# Patient Record
Sex: Female | Born: 1950 | Race: Black or African American | Hispanic: No | Smoking: Never smoker
Health system: Southern US, Community
[De-identification: ages and names within clinical notes are randomized; demographics above are authoritative.]

## PROBLEM LIST (undated history)

## (undated) DIAGNOSIS — B019 Varicella without complication: Secondary | ICD-10-CM

## (undated) DIAGNOSIS — I10 Essential (primary) hypertension: Secondary | ICD-10-CM

## (undated) DIAGNOSIS — M1990 Unspecified osteoarthritis, unspecified site: Secondary | ICD-10-CM

## (undated) DIAGNOSIS — D649 Anemia, unspecified: Secondary | ICD-10-CM

## (undated) DIAGNOSIS — E785 Hyperlipidemia, unspecified: Secondary | ICD-10-CM

## (undated) HISTORY — DX: Anemia, unspecified: D64.9

## (undated) HISTORY — PX: COLONOSCOPY: SHX174

## (undated) HISTORY — DX: Essential (primary) hypertension: I10

## (undated) HISTORY — DX: Unspecified osteoarthritis, unspecified site: M19.90

## (undated) HISTORY — DX: Varicella without complication: B01.9

---
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
## 1980-03-31 HISTORY — PX: TUBAL LIGATION: SHX77

## 1998-06-15 ENCOUNTER — Other Ambulatory Visit: Admission: RE | Admit: 1998-06-15 | Discharge: 1998-06-15 | Payer: Self-pay | Admitting: Gynecology

## 2000-03-05 ENCOUNTER — Other Ambulatory Visit: Admission: RE | Admit: 2000-03-05 | Discharge: 2000-03-05 | Payer: Self-pay | Admitting: Gynecology

## 2001-03-05 ENCOUNTER — Encounter: Admission: RE | Admit: 2001-03-05 | Discharge: 2001-03-05 | Payer: Self-pay | Admitting: General Surgery

## 2001-03-05 ENCOUNTER — Encounter: Payer: Self-pay | Admitting: General Surgery

## 2001-04-06 ENCOUNTER — Other Ambulatory Visit: Admission: RE | Admit: 2001-04-06 | Discharge: 2001-04-06 | Payer: Self-pay | Admitting: Gynecology

## 2001-08-19 ENCOUNTER — Encounter: Payer: Self-pay | Admitting: Gynecology

## 2001-08-19 ENCOUNTER — Encounter: Admission: RE | Admit: 2001-08-19 | Discharge: 2001-08-19 | Payer: Self-pay | Admitting: Gynecology

## 2002-02-07 ENCOUNTER — Encounter: Payer: Self-pay | Admitting: Gynecology

## 2002-02-07 ENCOUNTER — Encounter: Admission: RE | Admit: 2002-02-07 | Discharge: 2002-02-07 | Payer: Self-pay | Admitting: Gynecology

## 2002-05-17 ENCOUNTER — Other Ambulatory Visit: Admission: RE | Admit: 2002-05-17 | Discharge: 2002-05-17 | Payer: Self-pay | Admitting: Gynecology

## 2003-02-13 ENCOUNTER — Encounter: Admission: RE | Admit: 2003-02-13 | Discharge: 2003-02-13 | Payer: Self-pay | Admitting: Gynecology

## 2003-06-22 ENCOUNTER — Other Ambulatory Visit: Admission: RE | Admit: 2003-06-22 | Discharge: 2003-06-22 | Payer: Self-pay | Admitting: Gynecology

## 2004-02-14 ENCOUNTER — Encounter: Admission: RE | Admit: 2004-02-14 | Discharge: 2004-02-14 | Payer: Self-pay | Admitting: Gynecology

## 2004-07-09 ENCOUNTER — Other Ambulatory Visit: Admission: RE | Admit: 2004-07-09 | Discharge: 2004-07-09 | Payer: Self-pay | Admitting: Gynecology

## 2005-02-17 ENCOUNTER — Encounter: Admission: RE | Admit: 2005-02-17 | Discharge: 2005-02-17 | Payer: Self-pay | Admitting: Gynecology

## 2005-07-07 ENCOUNTER — Other Ambulatory Visit: Admission: RE | Admit: 2005-07-07 | Discharge: 2005-07-07 | Payer: Self-pay | Admitting: Gynecology

## 2006-02-18 ENCOUNTER — Encounter: Admission: RE | Admit: 2006-02-18 | Discharge: 2006-02-18 | Payer: Self-pay | Admitting: Gynecology

## 2006-07-13 ENCOUNTER — Other Ambulatory Visit: Admission: RE | Admit: 2006-07-13 | Discharge: 2006-07-13 | Payer: Self-pay | Admitting: Gynecology

## 2007-02-22 ENCOUNTER — Encounter: Admission: RE | Admit: 2007-02-22 | Discharge: 2007-02-22 | Payer: Self-pay | Admitting: Gynecology

## 2007-07-26 ENCOUNTER — Other Ambulatory Visit: Admission: RE | Admit: 2007-07-26 | Discharge: 2007-07-26 | Payer: Self-pay | Admitting: Gynecology

## 2008-02-23 ENCOUNTER — Encounter: Admission: RE | Admit: 2008-02-23 | Discharge: 2008-02-23 | Payer: Self-pay | Admitting: Gynecology

## 2009-02-23 ENCOUNTER — Encounter: Admission: RE | Admit: 2009-02-23 | Discharge: 2009-02-23 | Payer: Self-pay | Admitting: Gynecology

## 2009-08-03 ENCOUNTER — Emergency Department (HOSPITAL_COMMUNITY): Admission: EM | Admit: 2009-08-03 | Discharge: 2009-08-03 | Payer: Self-pay | Admitting: Emergency Medicine

## 2010-03-01 ENCOUNTER — Encounter: Admission: RE | Admit: 2010-03-01 | Discharge: 2010-03-01 | Payer: Self-pay | Admitting: Gynecology

## 2010-06-18 LAB — URINALYSIS, ROUTINE W REFLEX MICROSCOPIC
Nitrite: NEGATIVE
Protein, ur: NEGATIVE mg/dL
Urobilinogen, UA: 0.2 mg/dL (ref 0.0–1.0)

## 2010-06-18 LAB — COMPREHENSIVE METABOLIC PANEL
AST: 21 U/L (ref 0–37)
CO2: 30 mEq/L (ref 19–32)
Calcium: 9.1 mg/dL (ref 8.4–10.5)
Creatinine, Ser: 1.12 mg/dL (ref 0.4–1.2)
GFR calc Af Amer: >60 mL/min (ref >60)
GFR calc non Af Amer: 50 mL/min — ABNORMAL LOW (ref >60)
Total Protein: 6.7 g/dL (ref 6.0–8.3)

## 2010-06-18 LAB — CBC
MCV: 98.5 fL (ref 78.0–100.0)
Platelets: 242 10*3/uL (ref 150–400)
WBC: 4.5 10*3/uL (ref 4.0–10.5)

## 2010-06-18 LAB — TSH: TSH: 2.385 u[IU]/mL (ref 0.350–4.500)

## 2010-06-18 LAB — DIFFERENTIAL
Eosinophils Relative: 6 % — ABNORMAL HIGH (ref 0–5)
Lymphocytes Relative: 44 % (ref 12–46)
Lymphs Abs: 2 10*3/uL (ref 0.7–4.0)
Neutrophils Relative %: 43 % (ref 43–77)

## 2010-06-18 LAB — TROPONIN I: Troponin I: <0.01 ng/mL (ref 0.00–0.06)

## 2010-06-18 LAB — CK TOTAL AND CKMB (NOT AT ARMC)
CK, MB: 2.7 ng/mL (ref 0.3–4.0)
Total CK: 149 U/L (ref 7–177)

## 2011-02-17 ENCOUNTER — Other Ambulatory Visit: Payer: Self-pay | Admitting: Gynecology

## 2011-02-17 DIAGNOSIS — Z1231 Encounter for screening mammogram for malignant neoplasm of breast: Secondary | ICD-10-CM

## 2011-03-28 ENCOUNTER — Ambulatory Visit
Admission: RE | Admit: 2011-03-28 | Discharge: 2011-03-28 | Disposition: A | Discharge status: Home / Self Care | Payer: Self-pay | Source: Ambulatory Visit | Attending: Gynecology | Admitting: Gynecology

## 2011-03-28 DIAGNOSIS — Z1231 Encounter for screening mammogram for malignant neoplasm of breast: Secondary | ICD-10-CM

## 2012-03-03 ENCOUNTER — Other Ambulatory Visit: Payer: Self-pay | Admitting: Gynecology

## 2012-03-03 DIAGNOSIS — Z1231 Encounter for screening mammogram for malignant neoplasm of breast: Secondary | ICD-10-CM

## 2012-03-30 ENCOUNTER — Ambulatory Visit
Admission: RE | Admit: 2012-03-30 | Discharge: 2012-03-30 | Disposition: A | Discharge status: Home / Self Care | Payer: BC Managed Care – PPO | Source: Ambulatory Visit | Attending: Gynecology | Admitting: Gynecology

## 2012-03-30 DIAGNOSIS — Z1231 Encounter for screening mammogram for malignant neoplasm of breast: Secondary | ICD-10-CM

## 2013-03-14 ENCOUNTER — Other Ambulatory Visit: Payer: Self-pay

## 2013-03-14 DIAGNOSIS — Z1231 Encounter for screening mammogram for malignant neoplasm of breast: Secondary | ICD-10-CM

## 2013-04-19 ENCOUNTER — Ambulatory Visit
Admission: RE | Admit: 2013-04-19 | Discharge: 2013-04-19 | Disposition: A | Discharge status: Home / Self Care | Payer: BC Managed Care – PPO | Source: Ambulatory Visit

## 2013-04-19 DIAGNOSIS — Z1231 Encounter for screening mammogram for malignant neoplasm of breast: Secondary | ICD-10-CM

## 2014-09-11 ENCOUNTER — Other Ambulatory Visit: Payer: Self-pay

## 2014-09-11 DIAGNOSIS — Z1231 Encounter for screening mammogram for malignant neoplasm of breast: Secondary | ICD-10-CM

## 2014-10-04 ENCOUNTER — Ambulatory Visit
Admission: RE | Admit: 2014-10-04 | Discharge: 2014-10-04 | Disposition: A | Discharge status: Home / Self Care | Payer: BLUE CROSS/BLUE SHIELD | Source: Ambulatory Visit

## 2014-10-04 DIAGNOSIS — Z1231 Encounter for screening mammogram for malignant neoplasm of breast: Secondary | ICD-10-CM

## 2015-08-22 ENCOUNTER — Other Ambulatory Visit: Payer: Self-pay

## 2015-08-22 DIAGNOSIS — Z1231 Encounter for screening mammogram for malignant neoplasm of breast: Secondary | ICD-10-CM

## 2015-10-05 ENCOUNTER — Ambulatory Visit
Admission: RE | Admit: 2015-10-05 | Discharge: 2015-10-05 | Disposition: A | Discharge status: Home / Self Care | Payer: BLUE CROSS/BLUE SHIELD | Source: Ambulatory Visit

## 2015-10-05 DIAGNOSIS — Z1231 Encounter for screening mammogram for malignant neoplasm of breast: Secondary | ICD-10-CM

## 2016-07-17 ENCOUNTER — Other Ambulatory Visit: Payer: Self-pay | Admitting: Family Medicine

## 2016-07-17 DIAGNOSIS — Z1231 Encounter for screening mammogram for malignant neoplasm of breast: Secondary | ICD-10-CM

## 2016-10-07 ENCOUNTER — Encounter: Payer: Self-pay | Admitting: Radiology

## 2016-10-07 ENCOUNTER — Ambulatory Visit
Admission: RE | Admit: 2016-10-07 | Discharge: 2016-10-07 | Disposition: A | Discharge status: Home / Self Care | Payer: BLUE CROSS/BLUE SHIELD | Source: Ambulatory Visit | Attending: Family Medicine | Admitting: Family Medicine

## 2016-10-07 DIAGNOSIS — Z1231 Encounter for screening mammogram for malignant neoplasm of breast: Secondary | ICD-10-CM

## 2017-04-09 ENCOUNTER — Other Ambulatory Visit: Payer: Self-pay | Admitting: Orthopedic Surgery

## 2017-06-15 ENCOUNTER — Other Ambulatory Visit: Payer: Self-pay

## 2017-06-15 ENCOUNTER — Encounter (HOSPITAL_BASED_OUTPATIENT_CLINIC_OR_DEPARTMENT_OTHER): Payer: Self-pay | Admitting: *Deleted

## 2017-06-16 ENCOUNTER — Encounter (HOSPITAL_BASED_OUTPATIENT_CLINIC_OR_DEPARTMENT_OTHER)
Admission: RE | Admit: 2017-06-16 | Discharge: 2017-06-16 | Disposition: A | Discharge status: Home / Self Care | Payer: BLUE CROSS/BLUE SHIELD | Source: Ambulatory Visit | Attending: Orthopedic Surgery | Admitting: Orthopedic Surgery

## 2017-06-16 DIAGNOSIS — Z01818 Encounter for other preprocedural examination: Secondary | ICD-10-CM | POA: Insufficient documentation

## 2017-06-16 LAB — BASIC METABOLIC PANEL
ANION GAP: 8 (ref 5–15)
BUN: 23 mg/dL — AB (ref 6–20)
CHLORIDE: 107 mmol/L (ref 101–111)
CO2: 24 mmol/L (ref 22–32)
Calcium: 8.8 mg/dL — ABNORMAL LOW (ref 8.9–10.3)
Creatinine, Ser: 0.88 mg/dL (ref 0.44–1.00)
GFR calc non Af Amer: >60 mL/min (ref >60)
Glucose, Bld: 87 mg/dL (ref 65–99)
POTASSIUM: 4.6 mmol/L (ref 3.5–5.1)
SODIUM: 139 mmol/L (ref 135–145)

## 2017-06-17 NOTE — Progress Notes (Signed)
Lab results reviewed by Dr. Richardson LandryHouser, will proceed with surgery as scheduled.

## 2017-06-22 ENCOUNTER — Encounter (HOSPITAL_BASED_OUTPATIENT_CLINIC_OR_DEPARTMENT_OTHER)
Admission: RE | Disposition: A | Discharge status: Home / Self Care | Payer: Self-pay | Source: Ambulatory Visit | Attending: Orthopedic Surgery

## 2017-06-22 ENCOUNTER — Ambulatory Visit (HOSPITAL_BASED_OUTPATIENT_CLINIC_OR_DEPARTMENT_OTHER)
Admission: RE | Admit: 2017-06-22 | Discharge: 2017-06-22 | Disposition: A | Discharge status: Home / Self Care | Payer: BLUE CROSS/BLUE SHIELD | Source: Ambulatory Visit | Attending: Orthopedic Surgery | Admitting: Orthopedic Surgery

## 2017-06-22 ENCOUNTER — Encounter (HOSPITAL_BASED_OUTPATIENT_CLINIC_OR_DEPARTMENT_OTHER): Payer: Self-pay

## 2017-06-22 ENCOUNTER — Other Ambulatory Visit: Payer: Self-pay

## 2017-06-22 ENCOUNTER — Ambulatory Visit (HOSPITAL_BASED_OUTPATIENT_CLINIC_OR_DEPARTMENT_OTHER): Payer: BLUE CROSS/BLUE SHIELD | Admitting: Anesthesiology

## 2017-06-22 DIAGNOSIS — G5602 Carpal tunnel syndrome, left upper limb: Secondary | ICD-10-CM | POA: Diagnosis present

## 2017-06-22 DIAGNOSIS — Z79899 Other long term (current) drug therapy: Secondary | ICD-10-CM | POA: Diagnosis not present

## 2017-06-22 HISTORY — PX: CARPAL TUNNEL RELEASE: SHX101

## 2017-06-22 SURGERY — CARPAL TUNNEL RELEASE
Anesthesia: Regional | Site: Wrist | Laterality: Left

## 2017-06-22 MED ORDER — LACTATED RINGERS IV SOLN
INTRAVENOUS | Status: DC
  Administered 2017-06-22: 12:00:00 via INTRAVENOUS

## 2017-06-22 MED ORDER — SCOPOLAMINE 1 MG/3DAYS TD PT72: 1.0000 | MEDICATED_PATCH | Freq: Once | TRANSDERMAL | Status: DC | PRN

## 2017-06-22 MED ORDER — FENTANYL CITRATE (PF) 100 MCG/2ML IJ SOLN: 25.0000 ug | INTRAMUSCULAR | Status: DC | PRN

## 2017-06-22 MED ORDER — HYDROCODONE-ACETAMINOPHEN 5-325 MG PO TABS: ORAL_TABLET | ORAL | 0 refills | Status: DC

## 2017-06-22 MED ORDER — ONDANSETRON HCL 4 MG/2ML IJ SOLN
INTRAMUSCULAR | Status: DC | PRN
  Administered 2017-06-22: 4 mg via INTRAVENOUS

## 2017-06-22 MED ORDER — FENTANYL CITRATE (PF) 100 MCG/2ML IJ SOLN
INTRAMUSCULAR | Status: AC
  Filled 2017-06-22: qty 2

## 2017-06-22 MED ORDER — PROPOFOL 500 MG/50ML IV EMUL
INTRAVENOUS | Status: DC | PRN
  Administered 2017-06-22: 50 ug/kg/min via INTRAVENOUS

## 2017-06-22 MED ORDER — PROPOFOL 10 MG/ML IV BOLUS
INTRAVENOUS | Status: AC
  Filled 2017-06-22: qty 20

## 2017-06-22 MED ORDER — FENTANYL CITRATE (PF) 100 MCG/2ML IJ SOLN
50.0000 ug | INTRAMUSCULAR | Status: DC | PRN
  Administered 2017-06-22: 50 ug via INTRAVENOUS

## 2017-06-22 MED ORDER — CEFAZOLIN SODIUM-DEXTROSE 2-4 GM/100ML-% IV SOLN
INTRAVENOUS | Status: AC
  Filled 2017-06-22: qty 100

## 2017-06-22 MED ORDER — CHLORHEXIDINE GLUCONATE 4 % EX LIQD: 60.0000 mL | Freq: Once | CUTANEOUS | Status: DC

## 2017-06-22 MED ORDER — CEFAZOLIN SODIUM-DEXTROSE 2-4 GM/100ML-% IV SOLN
2.0000 g | INTRAVENOUS | Status: AC
  Administered 2017-06-22: 2 g via INTRAVENOUS

## 2017-06-22 MED ORDER — MIDAZOLAM HCL 2 MG/2ML IJ SOLN
INTRAMUSCULAR | Status: AC
  Filled 2017-06-22: qty 2

## 2017-06-22 MED ORDER — LACTATED RINGERS IV SOLN: INTRAVENOUS | Status: DC

## 2017-06-22 MED ORDER — MIDAZOLAM HCL 2 MG/2ML IJ SOLN
1.0000 mg | INTRAMUSCULAR | Status: DC | PRN
  Administered 2017-06-22: 1 mg via INTRAVENOUS

## 2017-06-22 MED ORDER — MEPERIDINE HCL 25 MG/ML IJ SOLN: 6.2500 mg | INTRAMUSCULAR | Status: DC | PRN

## 2017-06-22 MED ORDER — LIDOCAINE HCL (PF) 0.5 % IJ SOLN
INTRAMUSCULAR | Status: AC
  Filled 2017-06-22: qty 100

## 2017-06-22 MED ORDER — LIDOCAINE HCL (PF) 0.5 % IJ SOLN
INTRAMUSCULAR | Status: DC | PRN
  Administered 2017-06-22: 35 mL via INTRAVENOUS

## 2017-06-22 MED ORDER — BUPIVACAINE HCL (PF) 0.25 % IJ SOLN
INTRAMUSCULAR | Status: AC
  Filled 2017-06-22: qty 30

## 2017-06-22 MED ORDER — METOCLOPRAMIDE HCL 5 MG/ML IJ SOLN: 10.0000 mg | Freq: Once | INTRAMUSCULAR | Status: DC | PRN

## 2017-06-22 MED ORDER — LIDOCAINE HCL (CARDIAC) 20 MG/ML IV SOLN
INTRAVENOUS | Status: AC
  Filled 2017-06-22: qty 5

## 2017-06-22 SURGICAL SUPPLY — 37 items
BANDAGE ACE 3X5.8 VEL STRL LF (GAUZE/BANDAGES/DRESSINGS) ×2 IMPLANT
BLADE SURG 15 STRL LF DISP TIS (BLADE) ×2 IMPLANT
BLADE SURG 15 STRL SS (BLADE) ×4
BNDG CMPR 9X4 STRL LF SNTH (GAUZE/BANDAGES/DRESSINGS) ×1
BNDG ESMARK 4X9 LF (GAUZE/BANDAGES/DRESSINGS) ×1 IMPLANT
BNDG GAUZE ELAST 4 BULKY (GAUZE/BANDAGES/DRESSINGS) ×2 IMPLANT
CHLORAPREP W/TINT 26ML (MISCELLANEOUS) ×2 IMPLANT
CORD BIPOLAR FORCEPS 12FT (ELECTRODE) ×2 IMPLANT
COVER BACK TABLE 60X90IN (DRAPES) ×2 IMPLANT
COVER MAYO STAND STRL (DRAPES) ×2 IMPLANT
CUFF TOURNIQUET SINGLE 18IN (TOURNIQUET CUFF) ×2 IMPLANT
DRAPE EXTREMITY T 121X128X90 (DRAPE) ×2 IMPLANT
DRAPE SURG 17X23 STRL (DRAPES) ×2 IMPLANT
DRSG PAD ABDOMINAL 8X10 ST (GAUZE/BANDAGES/DRESSINGS) ×2 IMPLANT
GAUZE SPONGE 4X4 12PLY STRL (GAUZE/BANDAGES/DRESSINGS) ×2 IMPLANT
GAUZE XEROFORM 1X8 LF (GAUZE/BANDAGES/DRESSINGS) ×2 IMPLANT
GLOVE BIO SURGEON STRL SZ 6.5 (GLOVE) ×1 IMPLANT
GLOVE BIO SURGEON STRL SZ7.5 (GLOVE) ×2 IMPLANT
GLOVE BIOGEL PI IND STRL 7.0 (GLOVE) IMPLANT
GLOVE BIOGEL PI IND STRL 8 (GLOVE) ×1 IMPLANT
GLOVE BIOGEL PI INDICATOR 7.0 (GLOVE) ×1
GLOVE BIOGEL PI INDICATOR 8 (GLOVE) ×2
GOWN STRL REUS W/ TWL LRG LVL3 (GOWN DISPOSABLE) ×1 IMPLANT
GOWN STRL REUS W/TWL LRG LVL3 (GOWN DISPOSABLE) ×2
GOWN STRL REUS W/TWL XL LVL3 (GOWN DISPOSABLE) ×2 IMPLANT
NDL HYPO 25X1 1.5 SAFETY (NEEDLE) ×1 IMPLANT
NEEDLE HYPO 25X1 1.5 SAFETY (NEEDLE) ×2 IMPLANT
NS IRRIG 1000ML POUR BTL (IV SOLUTION) ×2 IMPLANT
PACK BASIN DAY SURGERY FS (CUSTOM PROCEDURE TRAY) ×2 IMPLANT
PADDING CAST ABS 4INX4YD NS (CAST SUPPLIES)
PADDING CAST ABS COTTON 4X4 ST (CAST SUPPLIES) ×1 IMPLANT
STOCKINETTE 4X48 STRL (DRAPES) ×2 IMPLANT
SUT ETHILON 4 0 PS 2 18 (SUTURE) ×2 IMPLANT
SYR BULB 3OZ (MISCELLANEOUS) ×2 IMPLANT
SYR CONTROL 10ML LL (SYRINGE) ×2 IMPLANT
TOWEL OR 17X24 6PK STRL BLUE (TOWEL DISPOSABLE) ×4 IMPLANT
UNDERPAD 30X30 (UNDERPADS AND DIAPERS) ×2 IMPLANT

## 2017-06-22 NOTE — Transfer of Care (Signed)
Immediate Anesthesia Transfer of Care Note  Patient: Christine Pitts  Procedure(s) Performed: LEFT CARPAL TUNNEL RELEASE (Left Wrist)  Patient Location: PACU  Anesthesia Type:MAC and Bier block  Level of Consciousness: awake, alert  and oriented  Airway & Oxygen Therapy: Patient Spontanous Breathing  Post-op Assessment: Report given to RN and Post -op Vital signs reviewed and stable  Post vital signs: Reviewed and stable  Last Vitals:  Vitals Value Taken Time  BP    Temp    Pulse    Resp    SpO2      Last Pain:  Vitals:   06/22/17 1154  TempSrc: Oral  PainSc: 0-No pain         Complications: No apparent anesthesia complications

## 2017-06-22 NOTE — Discharge Instructions (Addendum)
° °  ° ° ° °[facility] Center Instructions °Hand Surgery ° °Wound Care: °Keep your hand elevated above the level of your heart.  Do not allow it to dangle by your side.  Keep the dressing dry and do not remove it unless your doctor advises you to do so.  He will usually change it at the time of your post-op visit.  Moving your fingers is advised to stimulate circulation but will depend on the site of your surgery.  If you have a splint applied, your doctor will advise you regarding movement. ° °Activity: °Do not drive or operate machinery today.  Rest today and then you may return to your normal activity and work as indicated by your physician. ° °Diet:  °Drink liquids today or eat a light diet.  You may resume a regular diet tomorrow.   ° °General expectations: °Pain for two to three days. °Fingers may become slightly swollen. ° °Call your doctor if any of the following occur: °Severe pain not relieved by pain medication. °Elevated temperature. °Dressing soaked with blood. °Inability to move fingers. °White or bluish color to fingers. ° ° °Regional Anesthesia Blocks ° °1. Numbness or the inability to move the "blocked" extremity may last from 3-48 hours after placement. The length of time depends on the medication injected and your individual response to the medication. If the numbness is not going away after 48 hours, call your surgeon. ° °2. The extremity that is blocked will need to be protected until the numbness is gone and the  Strength has returned. Because you cannot feel it, you will need to take extra care to avoid injury. Because it may be weak, you may have difficulty moving it or using it. You may not know what position it is in without looking at it while the block is in effect. ° °3. For blocks in the legs and feet, returning to weight bearing and walking needs to be done carefully. You will need to wait until the numbness is entirely gone and the strength has returned. You should be able to move your leg  and foot normally before you try and bear weight or walk. You will need someone to be with you when you first try to ensure you do not fall and possibly risk injury. ° °4. Bruising and tenderness at the needle site are common side effects and will resolve in a few days. ° °5. Persistent numbness or new problems with movement should be communicated to the surgeon or the [facility] ([phone])/ [facility] ([phone]). ° ° ° °Post Anesthesia Home Care Instructions ° °Activity: °Get plenty of rest for the remainder of the day. A responsible individual must stay with you for 24 hours following the procedure.  °For the next 24 hours, DO NOT: °-Drive a car °-Operate machinery °-Drink alcoholic beverages °-Take any medication unless instructed by your physician °-Make any legal decisions or sign important papers. ° °Meals: °Start with liquid foods such as gelatin or soup. Progress to regular foods as tolerated. Avoid greasy, spicy, heavy foods. If nausea and/or vomiting occur, drink only clear liquids until the nausea and/or vomiting subsides. Call your physician if vomiting continues. ° °Special Instructions/Symptoms: °Your throat may feel dry or sore from the anesthesia or the breathing tube placed in your throat during surgery. If this causes discomfort, gargle with warm salt water. The discomfort should disappear within 24 hours. ° °If you had a scopolamine patch placed behind your ear for   the management of post- operative nausea and/or vomiting: ° °1. The medication in the patch is effective for 72 hours, after which it should be removed.  Wrap patch in a tissue and discard in the trash. Wash hands thoroughly with soap and water. °2. You may remove the patch earlier than 72 hours if you experience unpleasant side effects which may include dry mouth, dizziness or visual disturbances. °3. Avoid touching the patch. Wash your hands with soap and water after contact with the patch. °  ° ° °

## 2017-06-22 NOTE — Op Note (Signed)
06/22/2017 [facility]                              OPERATIVE REPORT   PREOPERATIVE DIAGNOSIS:  Left carpal tunnel syndrome.  POSTOPERATIVE DIAGNOSIS:  Left carpal tunnel syndrome.  PROCEDURE:  Left carpal tunnel release.  SURGEON:  Betha LoaKevin Mylen Mangan, MD  ASSISTANT:  none.  ANESTHESIA:  Bier block and sedation.  IV FLUIDS:  Per anesthesia flow sheet.  ESTIMATED BLOOD LOSS:  Minimal.  COMPLICATIONS:  None.  SPECIMENS:  None.  TOURNIQUET TIME:    Total Tourniquet Time Documented: Forearm (Left) - 23 minutes Total: Forearm (Left) - 23 minutes   DISPOSITION:  Stable to PACU.  LOCATION: [facility]  INDICATIONS:  67 yo female with numbness in left hand.  Positive nerve conduction studies.  She wishes to have a carpal tunnel release for management of her symptoms.  Risks, benefits and alternatives of surgery were discussed including the risk of blood loss; infection; damage to nerves, vessels, tendons, ligaments, bone; failure of surgery; need for additional surgery; complications with wound healing; continued pain; recurrence of carpal tunnel syndrome; and damage to motor branch. She voiced understanding of these risks and elected to proceed.   OPERATIVE COURSE:  After being identified preoperatively by myself, the patient and I agreed upon the procedure and site of procedure.  The surgical site was marked.  The risks, benefits, and alternatives of the surgery were reviewed and she wished to proceed.  Surgical consent had been signed.  She was given IV Ancef as preoperative antibiotic prophylaxis.  She was transferred to the operating room and placed on the operating room table in supine position with the Left upper extremity on an armboard.  Bier block and sedation was induced by Anesthesiology.  Left upper extremity was prepped and draped in normal sterile orthopaedic fashion.  A surgical pause was performed between the surgeons, anesthesia, and operating room  staff, and all were in agreement as to the patient, procedure, and site of procedure.  Tourniquet at the proximal aspect of the forearm had been inflated for the Bier block.   Incision was made over the transverse carpal ligament and carried into the subcutaneous tissues by spreading technique.  Bipolar electrocautery was used to obtain hemostasis.  The palmar fascia was sharply incised.  The transverse carpal ligament was identified and sharply incised.  It was incised distally first.  Care was taken to ensure complete decompression distally.  It was then incised proximally.  Scissors were used to split the distal aspect of the volar antebrachial fascia.  A finger was placed into the wound to ensure complete decompression, which was the case.  The nerve was examined.  It was adherent to the radial leaflet.  The motor branch was identified and was intact.  The wound was copiously irrigated with sterile saline.  It was then closed with 4-0 nylon in a horizontal mattress fashion.  It was injected with 0.25% plain Marcaine to aid in postoperative analgesia.  It was dressed with sterile Xeroform, 4x4s, an ABD, and wrapped with Kerlix and an Ace bandage.  Tourniquet was deflated at 23 minutes.  Fingertips were pink with brisk capillary refill after deflation of the tourniquet.  Operative drapes were broken down.  The patient was awoken from anesthesia safely.  She was transferred back to stretcher and taken to the PACU in stable condition.  I will see her back in the  office in 1 week for postoperative followup.  I will give her a prescription for norco 5/325 1-2 tabs PO q6 hours prn pain, dispense #20.    Tami Ribas, MD Electronically signed, 06/22/17

## 2017-06-22 NOTE — Brief Op Note (Signed)
06/22/2017  2:31 PM  PATIENT:  Christine Pitts  67 y.o. female  PRE-OPERATIVE DIAGNOSIS:  Left carpal tunnel syndrome  POST OPERATIVE DIAGNOSIS: Left carpal tunnel syndrome  PROCEDURE:  Procedure(s): LEFT CARPAL TUNNEL RELEASE (Left)  SURGEON:  Surgeon(s) and Role:    Betha Loa* Bandy Honaker, MD - Primary  PHYSICIAN ASSISTANT:   ASSISTANTS: none   ANESTHESIA:   Bier block with sedation  EBL:  Minimal   BLOOD ADMINISTERED:none  DRAINS: none   LOCAL MEDICATIONS USED:  MARCAINE     SPECIMEN:  No Specimen  DISPOSITION OF SPECIMEN:  N/A  COUNTS:  YES  TOURNIQUET:   Total Tourniquet Time Documented: Forearm (Left) - 23 minutes Total: Forearm (Left) - 23 minutes   DICTATION: .Note written in EPIC  PLAN OF CARE: Discharge to home after PACU  PATIENT DISPOSITION:  PACU - hemodynamically stable.

## 2017-06-22 NOTE — Anesthesia Preprocedure Evaluation (Signed)
Anesthesia Evaluation  Patient identified by MRN, date of birth, ID band Patient awake    Reviewed: Allergy & Precautions, NPO status , Patient's Chart, lab work & pertinent test results  Airway Mallampati: II  TM Distance: >3 FB Neck ROM: Full    Dental no notable dental hx.    Pulmonary neg pulmonary ROS,    Pulmonary exam normal breath sounds clear to auscultation       Cardiovascular negative cardio ROS Normal cardiovascular exam Rhythm:Regular Rate:Normal     Neuro/Psych negative neurological ROS  negative psych ROS   GI/Hepatic negative GI ROS, Neg liver ROS,   Endo/Other  negative endocrine ROS  Renal/GU negative Renal ROS  negative genitourinary   Musculoskeletal negative musculoskeletal ROS (+)   Abdominal   Peds negative pediatric ROS (+)  Hematology negative hematology ROS (+)   Anesthesia Other Findings   Reproductive/Obstetrics negative OB ROS                             Anesthesia Physical Anesthesia Plan  ASA: I  Anesthesia Plan: Bier Block and Bier Block-LIDOCAINE ONLY   Post-op Pain Management:    Induction:   PONV Risk Score and Plan: 2 and Treatment may vary due to age or medical condition  Airway Management Planned: Simple Face Mask  Additional Equipment:   Intra-op Plan:   Post-operative Plan:   Informed Consent: I have reviewed the patients History and Physical, chart, labs and discussed the procedure including the risks, benefits and alternatives for the proposed anesthesia with the patient or authorized representative who has indicated his/her understanding and acceptance.   Dental advisory given  Plan Discussed with:   Anesthesia Plan Comments:         Anesthesia Quick Evaluation

## 2017-06-22 NOTE — Anesthesia Procedure Notes (Signed)
Anesthesia Regional Block: Bier block (IV Regional)   Pre-Anesthetic Checklist: ,, timeout performed, Correct Patient, Correct Site, Correct Laterality, Correct Procedure, Correct Position, site marked, Risks and benefits discussed,  Surgical consent,  Pre-op evaluation,  At surgeon's request and post-op pain management  Laterality: Left  Prep: alcohol swabs        Procedures:,,,,,,,, #20gu IV placed  Narrative:  CRNA: Elanor Cale M, CRNA      

## 2017-06-22 NOTE — H&P (Signed)
  Christine Pitts is an 67 y.o. female.   Chief Complaint: left carpal tunnel syndrome HPI: 67 yo female with numbness in left long finger.  Positive nerve conduction studies.  She wishes to have a carpal tunnel release.  Allergies: No Known Allergies  History reviewed. No pertinent past medical history.  Past Surgical History:  Procedure Laterality Date  . TONSILLECTOMY  1962  . TUBAL LIGATION  1982    Family History: History reviewed. No pertinent family history.  Social History:   reports that she has never smoked. She has never used smokeless tobacco. She reports that she does not drink alcohol or use drugs.  Medications: Medications Prior to Admission  Medication Sig Dispense Refill  . Aspirin-Acetaminophen-Caffeine (GOODY HEADACHE PO) Take by mouth.    . hydrochlorothiazide (HYDRODIURIL) 25 MG tablet Take 25 mg by mouth daily.    . trandolapril-verapamil (TARKA) 2-240 MG tablet Take 1 tablet by mouth daily.      No results found for this or any previous visit (from the past 48 hour(s)).  No results found.   A comprehensive review of systems was negative.  Blood pressure 97/71, pulse 68, temperature 97.6 F (36.4 C), temperature source Oral, resp. rate 16, height 5\' 5"  (1.651 m), weight 73 kg (161 lb), SpO2 100 %.  General appearance: alert, cooperative and appears stated age Head: Normocephalic, without obvious abnormality, atraumatic Neck: supple, symmetrical, trachea midline Cardio: regular rate and rhythm Resp: clear to auscultation bilaterally Extremities: Intact sensation and capillary refill all digits.  +epl/fpl/io.  No wounds.  Pulses: 2+ and symmetric Skin: Skin color, texture, turgor normal. No rashes or lesions Neurologic: Grossly normal Incision/Wound: none  Assessment/Plan Left carpal tunnel syndrome.  Non operative and operative treatment options were discussed with the patient and patient wishes to proceed with operative treatment. Risks,  benefits, and alternatives of surgery were discussed and the patient agrees with the plan of care.   Nasean Zapf R 06/22/2017, 1:25 PM

## 2017-06-23 ENCOUNTER — Encounter (HOSPITAL_BASED_OUTPATIENT_CLINIC_OR_DEPARTMENT_OTHER): Payer: Self-pay | Admitting: Orthopedic Surgery

## 2017-06-23 NOTE — Anesthesia Postprocedure Evaluation (Signed)
Anesthesia Post Note  Patient: Christine Pitts  Procedure(s) Performed: LEFT CARPAL TUNNEL RELEASE (Left Wrist)     Patient location during evaluation: PACU Anesthesia Type: Bier Block Level of consciousness: awake and alert Pain management: pain level controlled Vital Signs Assessment: post-procedure vital signs reviewed and stable Respiratory status: spontaneous breathing, nonlabored ventilation, respiratory function stable and patient connected to nasal cannula oxygen Cardiovascular status: stable and blood pressure returned to baseline Postop Assessment: no apparent nausea or vomiting Anesthetic complications: no    Last Vitals:  Vitals:   06/22/17 1500 06/22/17 1515  BP: 121/84 119/72  Pulse: (!) 56 (!) 58  Resp: 13 14  Temp:  36.5 C  SpO2: 100% 100%    Last Pain:  Vitals:   06/22/17 1515  TempSrc:   PainSc: 0-No pain                 Phillips Groutarignan, Sheronda Parran

## 2017-06-24 ENCOUNTER — Encounter (HOSPITAL_BASED_OUTPATIENT_CLINIC_OR_DEPARTMENT_OTHER): Payer: Self-pay | Admitting: Orthopedic Surgery

## 2017-09-16 ENCOUNTER — Other Ambulatory Visit: Payer: Self-pay | Admitting: Internal Medicine

## 2017-09-16 DIAGNOSIS — E2839 Other primary ovarian failure: Secondary | ICD-10-CM

## 2017-09-22 ENCOUNTER — Other Ambulatory Visit: Payer: Self-pay | Admitting: Internal Medicine

## 2017-09-22 ENCOUNTER — Other Ambulatory Visit: Payer: Self-pay | Admitting: Family Medicine

## 2017-09-22 ENCOUNTER — Other Ambulatory Visit: Payer: Self-pay | Admitting: Obstetrics & Gynecology

## 2017-09-22 DIAGNOSIS — Z1231 Encounter for screening mammogram for malignant neoplasm of breast: Secondary | ICD-10-CM

## 2017-11-03 ENCOUNTER — Ambulatory Visit: Payer: Medicare Other

## 2017-11-03 ENCOUNTER — Other Ambulatory Visit: Payer: Medicare Other

## 2017-12-16 ENCOUNTER — Ambulatory Visit
Admission: RE | Admit: 2017-12-16 | Discharge: 2017-12-16 | Disposition: A | Discharge status: Home / Self Care | Payer: Medicare Other | Source: Ambulatory Visit | Attending: Obstetrics & Gynecology | Admitting: Obstetrics & Gynecology

## 2017-12-16 ENCOUNTER — Ambulatory Visit
Admission: RE | Admit: 2017-12-16 | Discharge: 2017-12-16 | Disposition: A | Discharge status: Home / Self Care | Payer: Medicare Other | Source: Ambulatory Visit | Attending: Internal Medicine | Admitting: Internal Medicine

## 2017-12-16 DIAGNOSIS — Z1231 Encounter for screening mammogram for malignant neoplasm of breast: Secondary | ICD-10-CM

## 2017-12-16 DIAGNOSIS — E2839 Other primary ovarian failure: Secondary | ICD-10-CM

## 2018-01-15 ENCOUNTER — Other Ambulatory Visit (INDEPENDENT_AMBULATORY_CARE_PROVIDER_SITE_OTHER): Payer: Medicare Other

## 2018-01-15 ENCOUNTER — Encounter: Payer: Self-pay | Admitting: Nurse Practitioner

## 2018-01-15 ENCOUNTER — Ambulatory Visit (INDEPENDENT_AMBULATORY_CARE_PROVIDER_SITE_OTHER): Payer: Medicare Other | Admitting: Nurse Practitioner

## 2018-01-15 VITALS — BP 134/78 | HR 74 | Temp 98.0°F | Ht 65.0 in | Wt 165.0 lb

## 2018-01-15 DIAGNOSIS — R609 Edema, unspecified: Secondary | ICD-10-CM

## 2018-01-15 DIAGNOSIS — I10 Essential (primary) hypertension: Secondary | ICD-10-CM

## 2018-01-15 LAB — BASIC METABOLIC PANEL
BUN: 20 mg/dL (ref 6–23)
CALCIUM: 9 mg/dL (ref 8.4–10.5)
CO2: 28 mEq/L (ref 19–32)
CREATININE: 0.84 mg/dL (ref 0.40–1.20)
Chloride: 105 mEq/L (ref 96–112)
GFR: 86.96 mL/min (ref >60.00)
GLUCOSE: 84 mg/dL (ref 70–99)
Potassium: 3.7 mEq/L (ref 3.5–5.1)
Sodium: 142 mEq/L (ref 135–145)

## 2018-01-15 MED ORDER — FUROSEMIDE 20 MG PO TABS: 20.0000 mg | ORAL_TABLET | Freq: Every day | ORAL | 2 refills | Status: DC

## 2018-01-15 MED ORDER — VALSARTAN 80 MG PO TABS: 80.0000 mg | ORAL_TABLET | Freq: Every day | ORAL | 1 refills | Status: DC

## 2018-01-15 NOTE — Assessment & Plan Note (Signed)
No edema noted on PE today Continue lasix 20 daily Update BMET Home management-elevation, compression, red flags and return precautions including when to seek immediate care discussed and printed on AVS F/U for new, worsening symptoms - furosemide (LASIX) 20 MG tablet; Take 1 tablet (20 mg total) by mouth daily.  Dispense: 30 tablet; Refill: 2

## 2018-01-15 NOTE — Patient Instructions (Addendum)
STOP losartan HCTZ CONTINUE lasix 20 daily START valsartan 80mg  daily.  Please try to check your blood pressure once daily or at least a few times a week, at the same time each day, and keep a log.   See you in 2 weeks   Edema Edema is when you have too much fluid in your body or under your skin. Edema may make your legs, feet, and ankles swell up. Swelling is also common in looser tissues, like around your eyes. This is a common condition. It gets more common as you get older. There are many possible causes of edema. Eating too much salt (sodium) and being on your feet or sitting for a long time can cause edema in your legs, feet, and ankles. Hot weather may make edema worse. Edema is usually painless. Your skin may look swollen or shiny. Follow these instructions at home:  Keep the swollen body part raised (elevated) above the level of your heart when you are sitting or lying down.  Do not sit still or stand for a long time.  Do not wear tight clothes. Do not wear garters on your upper legs.  Exercise your legs. This can help the swelling go down.  Wear elastic bandages or support stockings as told by your doctor.  Eat a low-salt (low-sodium) diet to reduce fluid as told by your doctor.  Depending on the cause of your swelling, you may need to limit how much fluid you drink (fluid restriction).  Take over-the-counter and prescription medicines only as told by your doctor. Contact a doctor if:  Treatment is not working.  You have heart, liver, or kidney disease and have symptoms of edema.  You have sudden and unexplained weight gain. Get help right away if:  You have shortness of breath or chest pain.  You cannot breathe when you lie down.  You have pain, redness, or warmth in the swollen areas.  You have heart, liver, or kidney disease and get edema all of a sudden.  You have a fever and your symptoms get worse all of a sudden. Summary  Edema is when you have too  much fluid in your body or under your skin.  Edema may make your legs, feet, and ankles swell up. Swelling is also common in looser tissues, like around your eyes.  Raise (elevate) the swollen body part above the level of your heart when you are sitting or lying down.  Follow your doctor's instructions about diet and how much fluid you can drink (fluid restriction). This information is not intended to replace advice given to you by your health care provider. Make sure you discuss any questions you have with your health care provider. Document Released: 09/03/2007 Document Revised: 04/04/2016 Document Reviewed: 04/04/2016 Elsevier Interactive Patient Education  2017 [employer].

## 2018-01-15 NOTE — Progress Notes (Signed)
Christine Pitts is a 67 y.o. female with the following history as recorded in EpicCare:  There are no active problems to display for this patient.   Current Outpatient Medications  Medication Sig Dispense Refill  . furosemide (LASIX) 20 MG tablet Take 1 tablet (20 mg total) by mouth daily. 30 tablet 2  . valsartan (DIOVAN) 80 MG tablet Take 1 tablet (80 mg total) by mouth daily. 30 tablet 1   No current facility-administered medications for this visit.     Allergies: Patient has no known allergies.  History reviewed. No pertinent past medical history.  Past Surgical History:  Procedure Laterality Date  . CARPAL TUNNEL RELEASE Left 06/22/2017   Procedure: LEFT CARPAL TUNNEL RELEASE;  Surgeon: Betha Loa, MD;  Location: [facility];  Service: Orthopedics;  Laterality: Left;  . TONSILLECTOMY  1962  . TUBAL LIGATION  1982    History reviewed. No pertinent family history.  Social History   Tobacco Use  . Smoking status: Never Smoker  . Smokeless tobacco: Never Used  Substance Use Topics  . Alcohol use: No    Frequency: Never     Subjective:  Ms Like is here today to establish care as a new patient to our practice, she has been getting regular care from an urgent care until now, has new insurance plan so decided to establish with PCP. Aside from primary care, she routinely follows with GYN provider for annual womens care. We will review daily medications today, per current med list is maintained on losartan-HCTZ 50-12.5 dialy for HTN, lasix 20 prn for edema.  she tells me she does not take losartan-HCTZ regularly, it makes her feel tired and she does not like the side effects, she only takes occasionally when she feels a headache or feels her blood pressure might be up. She does not monitor her BP at home. For the swelling in her legs, she has been taking her lasix 20 daily for some time now, does notice it seems to help the swelling, however She has started going to the  gym regularly this year and notices the swelling is a little worse after going to the gym, but once she takes her lasix the swelling is better. She has not noted any adverse effects to the lasix and would really like to keep taking it She overall feels well, she Denies weakness, syncope, confusion, vision changes, chest pain, shortness of breath, palpitations. Her legs feel achy at times but denies erythema, warmth, or tenderness in her legs.  BP Readings from Last 3 Encounters:  01/15/18 134/78  06/22/17 119/72   Wt Readings from Last 3 Encounters:  01/15/18 165 lb (74.8 kg)  06/22/17 161 lb (73 kg)   ROS- See HPI  Objective:  Vitals:   01/15/18 0824  BP: 134/78  Pulse: 74  Temp: 98 F (36.7 C)  TempSrc: Oral  SpO2: 98%  Weight: 165 lb (74.8 kg)  Height: 5\' 5"  (1.651 m)    General: Well developed, well nourished, in no acute distress  Skin : Warm and dry.  Head: Normocephalic and atraumatic  Eyes: Sclera and conjunctiva clear; pupils round and reactive to light; extraocular movements intact  Oropharynx: Pink, supple. No suspicious lesions  Neck: Supple Lungs: Respirations unlabored; clear to auscultation bilaterally without wheeze, rales, rhonchi  CVS exam: normal rate, regular rhythm, normal S1, S2, no murmurs, rubs, clicks or gallops.  Abdomen: Soft; nontender; nondistended Musculoskeletal: No deformities; no active joint inflammation  Extremities: No edema,  cyanosis Vessels: Symmetric bilaterally  Neurologic: Alert and oriented; speech intact; face symmetrical; moves all extremities well; CNII-XII intact without focal deficit   Assessment:  1. Essential hypertension   2. Edema, unspecified type     Plan:   Return in about 2 weeks (around 01/29/2018) for BP follow up- medication adjustments- recheck BP, BMET.  Orders Placed This Encounter  Procedures  . Basic metabolic panel    Standing Status:   Future    Standing Expiration Date:   01/16/2019    Requested  Prescriptions   Signed Prescriptions Disp Refills  . valsartan (DIOVAN) 80 MG tablet 30 tablet 1    Sig: Take 1 tablet (80 mg total) by mouth daily.  . furosemide (LASIX) 20 MG tablet 30 tablet 2    Sig: Take 1 tablet (20 mg total) by mouth daily.

## 2018-01-15 NOTE — Assessment & Plan Note (Signed)
Will keep ARB for renal protection, she does not like losartan and will not likely be compliant so we will try valsartan instead, continue lasix  Update BMET Encouraged to continue healthy diet, exercise, and monitor BP at home RTC in 2 weeks for F/U- recheck BP, BMET - Basic metabolic panel; Future - valsartan (DIOVAN) 80 MG tablet; Take 1 tablet (80 mg total) by mouth daily.  Dispense: 30 tablet; Refill: 1 - furosemide (LASIX) 20 MG tablet; Take 1 tablet (20 mg total) by mouth daily.  Dispense: 30 tablet; Refill: 2

## 2018-02-10 ENCOUNTER — Encounter: Payer: Self-pay | Admitting: Nurse Practitioner

## 2018-02-10 ENCOUNTER — Other Ambulatory Visit (INDEPENDENT_AMBULATORY_CARE_PROVIDER_SITE_OTHER): Payer: Medicare Other

## 2018-02-10 ENCOUNTER — Ambulatory Visit (INDEPENDENT_AMBULATORY_CARE_PROVIDER_SITE_OTHER): Payer: Medicare Other | Admitting: Nurse Practitioner

## 2018-02-10 VITALS — BP 120/86 | HR 75 | Ht 65.0 in | Wt 164.0 lb

## 2018-02-10 DIAGNOSIS — I10 Essential (primary) hypertension: Secondary | ICD-10-CM | POA: Diagnosis not present

## 2018-02-10 DIAGNOSIS — Z23 Encounter for immunization: Secondary | ICD-10-CM | POA: Diagnosis not present

## 2018-02-10 LAB — BASIC METABOLIC PANEL
BUN: 23 mg/dL (ref 6–23)
CHLORIDE: 102 meq/L (ref 96–112)
CO2: 31 mEq/L (ref 19–32)
Calcium: 9.5 mg/dL (ref 8.4–10.5)
Creatinine, Ser: 0.85 mg/dL (ref 0.40–1.20)
GFR: 85.77 mL/min (ref >60.00)
GLUCOSE: 100 mg/dL — AB (ref 70–99)
POTASSIUM: 3.5 meq/L (ref 3.5–5.1)
SODIUM: 142 meq/L (ref 135–145)

## 2018-02-10 NOTE — Progress Notes (Signed)
Christine Pitts is a 67 y.o. female with the following history as recorded in EpicCare:  Patient Active Problem List   Diagnosis Date Noted  . Essential hypertension 01/15/2018  . Edema 01/15/2018    Current Outpatient Medications  Medication Sig Dispense Refill  . furosemide (LASIX) 20 MG tablet Take 1 tablet (20 mg total) by mouth daily. 30 tablet 2  . valsartan (DIOVAN) 80 MG tablet Take 1 tablet (80 mg total) by mouth daily. 30 tablet 1   No current facility-administered medications for this visit.     Allergies: Patient has no known allergies.  Past Medical History:  Diagnosis Date  . Arthritis   . Chicken pox   . Hypertension     Past Surgical History:  Procedure Laterality Date  . CARPAL TUNNEL RELEASE Left 06/22/2017   Procedure: LEFT CARPAL TUNNEL RELEASE;  Surgeon: Betha Loa, MD;  Location: [facility];  Service: Orthopedics;  Laterality: Left;  . TONSILLECTOMY  1962  . TUBAL LIGATION  1982    Family History  Problem Relation Age of Onset  . Arthritis Mother   . Heart attack Mother   . Stroke Mother   . Hypertension Father     Social History   Tobacco Use  . Smoking status: Never Smoker  . Smokeless tobacco: Never Used  Substance Use Topics  . Alcohol use: No    Frequency: Never     Subjective:   Here today for HTN follow up, at her last visit on 01/15/18, her BP was elevated but she was not taking her losartan-HCTZ daily due to medication side effects. We decided to try her on different medication for her blood pressure, she was started on valsartan 80mg  daily, she was also continued on lasix 20 daily. Today, she tells me that she has been taking valsartan daily as prescribed, tolerating well, has not noted any adverse effects. Has also started trying to walk more, walks several miles a day on treadmill Does not check her BP at home Denies weakness, syncope, confusion, vision changes, chest pain, shortness of breath.  BP Readings from  Last 3 Encounters:  02/10/18 120/86  01/15/18 134/78  06/22/17 119/72    ROS- See HPI  Objective:  Vitals:   02/10/18 0924  BP: 120/86  Pulse: 75  SpO2: 96%  Weight: 164 lb (74.4 kg)  Height: 5\' 5"  (1.651 m)    Body mass index is 27.29 kg/m.  General: Well developed, well nourished, in no acute distress  Skin : Warm and dry.  Head: Normocephalic and atraumatic  Eyes: Sclera and conjunctiva clear; pupils round and reactive to light; extraocular movements intact  Oropharynx: Pink, supple. No suspicious lesions  Neck: Supple Lungs: Respirations unlabored; clear to auscultation bilaterally without wheeze, rales, rhonchi  CVS exam: normal rate, regular rhythm, normal S1, S2, no murmurs, rubs, clicks or gallops.  Extremities: No edema, cyanosis Vessels: Symmetric bilaterally  Neurologic: Alert and oriented; speech intact; face symmetrical; moves all extremities well; CNII-XII intact without focal deficit    Assessment:  1. Essential hypertension   2. Need for Tdap vaccination     Plan:   Return in about 3 months (around 05/13/2018) for CPE, BP recheck.  Orders Placed This Encounter  Procedures  . Tdap vaccine greater than or equal to 7yo IM  . Basic metabolic panel    Standing Status:   Future    Standing Expiration Date:   02/11/2019    Requested Prescriptions    No  prescriptions requested or ordered in this encounter   Reviewed HM: Need for Tdap vaccination- Tdap vaccine greater than or equal to 7yo IM

## 2018-02-10 NOTE — Patient Instructions (Signed)
Head downstairs for lab work today  Please return in about 3 months for annual physical with fasting lab work   DASH Eating Plan DASH stands for "Dietary Approaches to Stop Hypertension." The DASH eating plan is a healthy eating plan that has been shown to reduce high blood pressure (hypertension). It may also reduce your risk for type 2 diabetes, heart disease, and stroke. The DASH eating plan may also help with weight loss. What are tips for following this plan? General guidelines  Avoid eating more than 2,300 mg (milligrams) of salt (sodium) a day. If you have hypertension, you may need to reduce your sodium intake to 1,500 mg a day.  Limit alcohol intake to no more than 1 drink a day for nonpregnant women and 2 drinks a day for men. One drink equals 12 oz of beer, 5 oz of wine, or 1 oz of hard liquor.  Work with your health care provider to maintain a healthy body weight or to lose weight. Ask what an ideal weight is for you.  Get at least 30 minutes of exercise that causes your heart to beat faster (aerobic exercise) most days of the week. Activities may include walking, swimming, or biking.  Work with your health care provider or diet and nutrition specialist (dietitian) to adjust your eating plan to your individual calorie needs. Reading food labels  Check food labels for the amount of sodium per serving. Choose foods with less than 5 percent of the Daily Value of sodium. Generally, foods with less than 300 mg of sodium per serving fit into this eating plan.  To find whole grains, look for the word "whole" as the first word in the ingredient list. Shopping  Buy products labeled as "low-sodium" or "no salt added."  Buy fresh foods. Avoid canned foods and premade or frozen meals. Cooking  Avoid adding salt when cooking. Use salt-free seasonings or herbs instead of table salt or sea salt. Check with your health care provider or pharmacist before using salt substitutes.  Do not  fry foods. Cook foods using healthy methods such as baking, boiling, grilling, and broiling instead.  Cook with heart-healthy oils, such as olive, canola, soybean, or sunflower oil. Meal planning   Eat a balanced diet that includes: ? 5 or more servings of fruits and vegetables each day. At each meal, try to fill half of your plate with fruits and vegetables. ? Up to 6-8 servings of whole grains each day. ? Less than 6 oz of lean meat, poultry, or fish each day. A 3-oz serving of meat is about the same size as a deck of cards. One egg equals 1 oz. ? 2 servings of low-fat dairy each day. ? A serving of nuts, seeds, or beans 5 times each week. ? Heart-healthy fats. Healthy fats called Omega-3 fatty acids are found in foods such as flaxseeds and coldwater fish, like sardines, salmon, and mackerel.  Limit how much you eat of the following: ? Canned or prepackaged foods. ? Food that is high in trans fat, such as fried foods. ? Food that is high in saturated fat, such as fatty meat. ? Sweets, desserts, sugary drinks, and other foods with added sugar. ? Full-fat dairy products.  Do not salt foods before eating.  Try to eat at least 2 vegetarian meals each week.  Eat more home-cooked food and less restaurant, buffet, and fast food.  When eating at a restaurant, ask that your food be prepared with less salt or no  salt, if possible. What foods are recommended? The items listed may not be a complete list. Talk with your dietitian about what dietary choices are best for you. Grains Whole-grain or whole-wheat bread. Whole-grain or whole-wheat pasta. Brown rice. Modena Morrow. Bulgur. Whole-grain and low-sodium cereals. Pita bread. Low-fat, low-sodium crackers. Whole-wheat flour tortillas. Vegetables Fresh or frozen vegetables (raw, steamed, roasted, or grilled). Low-sodium or reduced-sodium tomato and vegetable juice. Low-sodium or reduced-sodium tomato sauce and tomato paste. Low-sodium or  reduced-sodium canned vegetables. Fruits All fresh, dried, or frozen fruit. Canned fruit in natural juice (without added sugar). Meat and other protein foods Skinless chicken or [place]. Ground chicken or [place]. Pork with fat trimmed off. Fish and seafood. Egg whites. Dried beans, peas, or lentils. Unsalted nuts, nut butters, and seeds. Unsalted canned beans. Lean cuts of beef with fat trimmed off. Low-sodium, lean deli meat. Dairy Low-fat (1%) or fat-free (skim) milk. Fat-free, low-fat, or reduced-fat cheeses. Nonfat, low-sodium ricotta or cottage cheese. Low-fat or nonfat yogurt. Low-fat, low-sodium cheese. Fats and oils Soft margarine without trans fats. Vegetable oil. Low-fat, reduced-fat, or light mayonnaise and salad dressings (reduced-sodium). Canola, safflower, olive, soybean, and sunflower oils. Avocado. Seasoning and other foods Herbs. Spices. Seasoning mixes without salt. Unsalted popcorn and pretzels. Fat-free sweets. What foods are not recommended? The items listed may not be a complete list. Talk with your dietitian about what dietary choices are best for you. Grains Baked goods made with fat, such as croissants, muffins, or some breads. Dry pasta or rice meal packs. Vegetables Creamed or fried vegetables. Vegetables in a cheese sauce. Regular canned vegetables (not low-sodium or reduced-sodium). Regular canned tomato sauce and paste (not low-sodium or reduced-sodium). Regular tomato and vegetable juice (not low-sodium or reduced-sodium). Angie Fava. Olives. Fruits Canned fruit in a light or heavy syrup. Fried fruit. Fruit in cream or butter sauce. Meat and other protein foods Fatty cuts of meat. Ribs. Fried meat. Berniece Salines. Sausage. Bologna and other processed lunch meats. Salami. Fatback. Hotdogs. Bratwurst. Salted nuts and seeds. Canned beans with added salt. Canned or smoked fish. Whole eggs or egg yolks. Chicken or [place] with skin. Dairy Whole or 2% milk, cream, and half-and-half.  Whole or full-fat cream cheese. Whole-fat or sweetened yogurt. Full-fat cheese. Nondairy creamers. Whipped toppings. Processed cheese and cheese spreads. Fats and oils Butter. Stick margarine. Lard. Shortening. Ghee. Bacon fat. Tropical oils, such as coconut, palm kernel, or palm oil. Seasoning and other foods Salted popcorn and pretzels. Onion salt, garlic salt, seasoned salt, table salt, and sea salt. Worcestershire sauce. Tartar sauce. Barbecue sauce. Teriyaki sauce. Soy sauce, including reduced-sodium. Steak sauce. Canned and packaged gravies. Fish sauce. Oyster sauce. Cocktail sauce. Horseradish that you find on the shelf. Ketchup. Mustard. Meat flavorings and tenderizers. Bouillon cubes. Hot sauce and Tabasco sauce. Premade or packaged marinades. Premade or packaged taco seasonings. Relishes. Regular salad dressings. Where to find more information:  National Heart, Lung, and [place]: https://wilson-eaton.com/  American Heart Association: www.heart.org Summary  The DASH eating plan is a healthy eating plan that has been shown to reduce high blood pressure (hypertension). It may also reduce your risk for type 2 diabetes, heart disease, and stroke.  With the DASH eating plan, you should limit salt (sodium) intake to 2,300 mg a day. If you have hypertension, you may need to reduce your sodium intake to 1,500 mg a day.  When on the DASH eating plan, aim to eat more fresh fruits and vegetables, whole grains, lean proteins, low-fat dairy, and heart-healthy  fats.  Work with your health care provider or diet and nutrition specialist (dietitian) to adjust your eating plan to your individual calorie needs. This information is not intended to replace advice given to you by your health care provider. Make sure you discuss any questions you have with your health care provider. Document Released: 03/06/2011 Document Revised: 03/10/2016 Document Reviewed: 03/10/2016 Elsevier Interactive Patient Education   [employer].

## 2018-02-10 NOTE — Assessment & Plan Note (Addendum)
BP has improved on valsartan, will continue  Update BMET Discussed the role of healthy diet and exercise in the management of HTN. Additional education provided on AVS RTC in 3 months for CPE, BP recheck - Basic metabolic panel; Future

## 2018-03-11 ENCOUNTER — Other Ambulatory Visit: Payer: Self-pay | Admitting: Nurse Practitioner

## 2018-03-11 DIAGNOSIS — I10 Essential (primary) hypertension: Secondary | ICD-10-CM

## 2018-05-17 ENCOUNTER — Encounter: Payer: Self-pay | Admitting: Nurse Practitioner

## 2018-05-17 ENCOUNTER — Other Ambulatory Visit (INDEPENDENT_AMBULATORY_CARE_PROVIDER_SITE_OTHER): Payer: Medicare Other

## 2018-05-17 ENCOUNTER — Ambulatory Visit (INDEPENDENT_AMBULATORY_CARE_PROVIDER_SITE_OTHER): Payer: Medicare Other | Admitting: Nurse Practitioner

## 2018-05-17 VITALS — BP 130/88 | HR 80 | Ht 65.0 in | Wt 163.0 lb

## 2018-05-17 DIAGNOSIS — R7309 Other abnormal glucose: Secondary | ICD-10-CM

## 2018-05-17 DIAGNOSIS — I10 Essential (primary) hypertension: Secondary | ICD-10-CM

## 2018-05-17 DIAGNOSIS — Z0000 Encounter for general adult medical examination without abnormal findings: Secondary | ICD-10-CM

## 2018-05-17 DIAGNOSIS — Z23 Encounter for immunization: Secondary | ICD-10-CM

## 2018-05-17 DIAGNOSIS — Z1159 Encounter for screening for other viral diseases: Secondary | ICD-10-CM | POA: Diagnosis not present

## 2018-05-17 DIAGNOSIS — Z1211 Encounter for screening for malignant neoplasm of colon: Secondary | ICD-10-CM | POA: Insufficient documentation

## 2018-05-17 DIAGNOSIS — Z9189 Other specified personal risk factors, not elsewhere classified: Secondary | ICD-10-CM

## 2018-05-17 DIAGNOSIS — Z13220 Encounter for screening for lipoid disorders: Secondary | ICD-10-CM

## 2018-05-17 LAB — CBC
HCT: 39.1 % (ref 36.0–46.0)
Hemoglobin: 12.9 g/dL (ref 12.0–15.0)
MCHC: 33 g/dL (ref 30.0–36.0)
MCV: 97 fl (ref 78.0–100.0)
Platelets: 360 10*3/uL (ref 150.0–400.0)
RBC: 4.02 Mil/uL (ref 3.87–5.11)
RDW: 13.9 % (ref 11.5–15.5)
WBC: 3.7 10*3/uL — ABNORMAL LOW (ref 4.0–10.5)

## 2018-05-17 LAB — LIPID PANEL
Cholesterol: 232 mg/dL — ABNORMAL HIGH (ref 0–200)
HDL: 81.9 mg/dL (ref >39.00)
LDL Cholesterol: 134 mg/dL — ABNORMAL HIGH (ref 0–99)
NonHDL: 150.57
Total CHOL/HDL Ratio: 3
Triglycerides: 81 mg/dL (ref 0.0–149.0)
VLDL: 16.2 mg/dL (ref 0.0–40.0)

## 2018-05-17 LAB — COMPREHENSIVE METABOLIC PANEL
ALK PHOS: 95 U/L (ref 39–117)
ALT: 12 U/L (ref 0–35)
AST: 14 U/L (ref 0–37)
Albumin: 4.6 g/dL (ref 3.5–5.2)
BUN: 29 mg/dL — ABNORMAL HIGH (ref 6–23)
CO2: 27 mEq/L (ref 19–32)
Calcium: 9.4 mg/dL (ref 8.4–10.5)
Chloride: 104 mEq/L (ref 96–112)
Creatinine, Ser: 0.87 mg/dL (ref 0.40–1.20)
GFR: 78.5 mL/min (ref >60.00)
Glucose, Bld: 89 mg/dL (ref 70–99)
Potassium: 3.7 mEq/L (ref 3.5–5.1)
SODIUM: 142 meq/L (ref 135–145)
Total Bilirubin: 0.4 mg/dL (ref 0.2–1.2)
Total Protein: 7.3 g/dL (ref 6.0–8.3)

## 2018-05-17 LAB — HEMOGLOBIN A1C: Hgb A1c MFr Bld: 5.5 % (ref 4.6–6.5)

## 2018-05-17 NOTE — Assessment & Plan Note (Signed)
Stable Continue current medications Update annual labs - Comprehensive metabolic panel; Future - CBC; Future - Lipid panel; Future

## 2018-05-17 NOTE — Assessment & Plan Note (Addendum)
Reviewed annual screening exams, healthy lifestyle, additional information provided on AVS She will look for colonoscopy records to update chart, we also sent request to [facility]- sounds like this might be where she had her colonoscopy - Comprehensive metabolic panel; Future - CBC; Future - Lipid panel; Future - Hemoglobin A1c; Future - Hepatitis C antibody; Future  Encounter for hepatitis C virus screening test for high risk patient - Hepatitis C antibody; Future  Elevated glucose - Comprehensive metabolic panel; Future - Hemoglobin A1c; Future  Screening for cholesterol level She did take a few sips of diet drink this am, otherwise fasting - Lipid panel; Future  Need for pneumococcal vaccination - Pneumococcal conjugate vaccine 13-valent IM

## 2018-05-17 NOTE — Progress Notes (Signed)
Christine Pitts is a 68 y.o. female with the following history as recorded in EpicCare:  Patient Active Problem List   Diagnosis Date Noted  . Routine general medical examination at a health care facility 05/17/2018  . Essential hypertension 01/15/2018  . Edema 01/15/2018    Current Outpatient Medications  Medication Sig Dispense Refill  . furosemide (LASIX) 20 MG tablet Take 1 tablet (20 mg total) by mouth daily. 30 tablet 2  . valsartan (DIOVAN) 80 MG tablet TAKE 1 TABLET(80 MG) BY MOUTH DAILY 30 tablet 2   No current facility-administered medications for this visit.     Allergies: Patient has no known allergies.  Past Medical History:  Diagnosis Date  . Arthritis   . Chicken pox   . Hypertension     Past Surgical History:  Procedure Laterality Date  . CARPAL TUNNEL RELEASE Left 06/22/2017   Procedure: LEFT CARPAL TUNNEL RELEASE;  Surgeon: Betha Loa, MD;  Location: [facility];  Service: Orthopedics;  Laterality: Left;  . TONSILLECTOMY  1962  . TUBAL LIGATION  1982    Family History  Problem Relation Age of Onset  . Arthritis Mother   . Heart attack Mother   . Stroke Mother   . Hypertension Father     Social History   Tobacco Use  . Smoking status: Never Smoker  . Smokeless tobacco: Never Used  Substance Use Topics  . Alcohol use: No    Frequency: Never     Subjective:  Christine Pitts is here today for CPE. Feels well today, without complaints. Last dental exam: 2020 Last vision exam: 2019 Mammogram- up to date, ordered by GYN provider Lung ca screening: n/a- never a smoker  Colonoscopy: up to date per pt- she is going to look for records at home Lipids: lipid panel ordered DM screening: a1c ordered Vaccinations: pna vaccine today Diet and exercise: going to gym, diet drinks  Hypertension -maintained on lasix 20, valsartan 80 Reports daily medication compliance without adverse medication effects.  BP Readings from Last 3 Encounters:  05/17/18  130/88  02/10/18 120/86  01/15/18 134/78    Review of Systems  Constitutional: Negative for chills and fever.  HENT: Negative for hearing loss.   Eyes: Negative for blurred vision and double vision.  Respiratory: Negative for cough and shortness of breath.   Cardiovascular: Negative for chest pain and palpitations.  Gastrointestinal: Negative for abdominal pain, blood in stool, constipation, diarrhea, nausea and vomiting.  Genitourinary: Negative for dysuria and hematuria.  Musculoskeletal: Negative for falls.  Skin: Negative for rash.  Neurological: Negative for dizziness, tingling, speech change, loss of consciousness and weakness.  Endo/Heme/Allergies: Does not bruise/bleed easily.  Psychiatric/Behavioral: Negative for depression. The patient is not nervous/anxious.    Objective:  Vitals:   05/17/18 0811  BP: 130/88  Pulse: 80  SpO2: 97%  Weight: 163 lb (73.9 kg)  Height: 5\' 5"  (1.651 m)  Body mass index is 27.12 kg/m.  General: Well developed, well nourished, in no acute distress  Skin : Warm and dry.  Head: Normocephalic and atraumatic  Eyes: Sclera and conjunctiva clear; pupils round and reactive to light; extraocular movements intact  Ears: External normal; canals clear; tympanic membranes normal  Oropharynx: Pink, supple. No suspicious lesions  Neck: Supple without thyromegaly, adenopathy  Lungs: Respirations unlabored; clear to auscultation bilaterally without wheeze, rales, rhonchi  CVS exam: normal rate, regular rhythm, normal S1, S2, no murmurs, rubs, clicks or gallops.  Abdomen: Soft; nontender; nondistended; no masses  or hepatosplenomegaly  Musculoskeletal: No deformities; no active joint inflammation  Extremities: No edema, cyanosis, clubbing  Vessels: Symmetric bilaterally  Neurologic: Alert and oriented; speech intact; face symmetrical; moves all extremities well; CNII-XII intact without focal deficit  Psychiatric: Normal mood and affect.  Assessment:   1. Routine general medical examination at a health care facility   2. Essential hypertension   3. Encounter for hepatitis C virus screening test for high risk patient   4. Elevated glucose   5. Screening for cholesterol level   6. Need for pneumococcal vaccination     Plan:   Return in about 6 months (around 11/15/2018) for routine follow up- check BP for stability.  Orders Placed This Encounter  Procedures  . Pneumococcal conjugate vaccine 13-valent IM  . Comprehensive metabolic panel    Standing Status:   Future    Number of Occurrences:   1    Standing Expiration Date:   05/18/2019  . CBC    Standing Status:   Future    Number of Occurrences:   1    Standing Expiration Date:   05/18/2019  . Lipid panel    Standing Status:   Future    Number of Occurrences:   1    Standing Expiration Date:   05/18/2019  . Hemoglobin A1c    Standing Status:   Future    Number of Occurrences:   1    Standing Expiration Date:   05/18/2019  . Hepatitis C antibody    Standing Status:   Future    Number of Occurrences:   1    Standing Expiration Date:   06/15/2018    Requested Prescriptions    No prescriptions requested or ordered in this encounter

## 2018-05-17 NOTE — Patient Instructions (Addendum)
Head downstairs for labs today  I will plan to see you back in 6 months, sooner if needed  [facility]- [phone]   Health Maintenance After Age 68 After age 27, you are at a higher risk for certain long-term diseases and infections as well as injuries from falls. Falls are a major cause of broken bones and head injuries in people who are older than age 41. Getting regular preventive care can help to keep you healthy and well. Preventive care includes getting regular testing and making lifestyle changes as recommended by your health care provider. Talk with your health care provider about:  Which screenings and tests you should have. A screening is a test that checks for a disease when you have no symptoms.  A diet and exercise plan that is right for you. What should I know about screenings and tests to prevent falls? Screening and testing are the best ways to find a health problem early. Early diagnosis and treatment give you the best chance of managing medical conditions that are common after age 37. Certain conditions and lifestyle choices may make you more likely to have a fall. Your health care provider may recommend:  Regular vision checks. Poor vision and conditions such as cataracts can make you more likely to have a fall. If you wear glasses, make sure to get your prescription updated if your vision changes.  Medicine review. Work with your health care provider to regularly review all of the medicines you are taking, including over-the-counter medicines. Ask your health care provider about any side effects that may make you more likely to have a fall. Tell your health care provider if any medicines that you take make you feel dizzy or sleepy.  Osteoporosis screening. Osteoporosis is a condition that causes the bones to get weaker. This can make the bones weak and cause them to break more easily.  Blood pressure screening. Blood pressure changes and medicines to control  blood pressure can make you feel dizzy.  Strength and balance checks. Your health care provider may recommend certain tests to check your strength and balance while standing, walking, or changing positions.  Foot health exam. Foot pain and numbness, as well as not wearing proper footwear, can make you more likely to have a fall.  Depression screening. You may be more likely to have a fall if you have a fear of falling, feel emotionally low, or feel unable to do activities that you used to do.  Alcohol use screening. Using too much alcohol can affect your balance and may make you more likely to have a fall. What actions can I take to lower my risk of falls? General instructions  Talk with your health care provider about your risks for falling. Tell your health care provider if: ? You fall. Be sure to tell your health care provider about all falls, even ones that seem minor. ? You feel dizzy, sleepy, or off-balance.  Take over-the-counter and prescription medicines only as told by your health care provider. These include any supplements.  Eat a healthy diet and maintain a healthy weight. A healthy diet includes low-fat dairy products, low-fat (lean) meats, and fiber from whole grains, beans, and lots of fruits and vegetables. Home safety  Remove any tripping hazards, such as rugs, cords, and clutter.  Install safety equipment such as grab bars in bathrooms and safety rails on stairs.  Keep rooms and walkways well-lit. Activity   Follow a regular exercise program to stay fit.  This will help you maintain your balance. Ask your health care provider what types of exercise are appropriate for you.  If you need a cane or walker, use it as recommended by your health care provider.  Wear supportive shoes that have nonskid soles. Lifestyle  Do not drink alcohol if your health care provider tells you not to drink.  If you drink alcohol, limit how much you have: ? 0-1 drink a day for  women. ? 0-2 drinks a day for men.  Be aware of how much alcohol is in your drink. In the [place] one drink equals one typical bottle of beer (12 oz), one-half glass of wine (5 oz), or one shot of hard liquor (1 oz).  Do not use any products that contain nicotine or tobacco, such as cigarettes and e-cigarettes. If you need help quitting, ask your health care provider. Summary  Having a healthy lifestyle and getting preventive care can help to protect your health and wellness after age 81.  Screening and testing are the best way to find a health problem early and help you avoid having a fall. Early diagnosis and treatment give you the best chance for managing medical conditions that are more common for people who are older than age 17.  Falls are a major cause of broken bones and head injuries in people who are older than age 15. Take precautions to prevent a fall at home.  Work with your health care provider to learn what changes you can make to improve your health and wellness and to prevent falls. This information is not intended to replace advice given to you by your health care provider. Make sure you discuss any questions you have with your health care provider. Document Released: 01/28/2017 Document Revised: 01/28/2017 Document Reviewed: 01/28/2017 Elsevier Interactive Patient Education  2019 [employer].

## 2018-05-18 LAB — HEPATITIS C ANTIBODY
Hepatitis C Ab: NONREACTIVE
SIGNAL TO CUT-OFF: 0.03 (ref <1.00)

## 2018-05-27 ENCOUNTER — Other Ambulatory Visit: Payer: Self-pay | Admitting: Nurse Practitioner

## 2018-05-27 DIAGNOSIS — E785 Hyperlipidemia, unspecified: Secondary | ICD-10-CM

## 2018-05-27 MED ORDER — ATORVASTATIN CALCIUM 10 MG PO TABS: 10.0000 mg | ORAL_TABLET | Freq: Every day | ORAL | 1 refills | Status: DC

## 2018-06-15 ENCOUNTER — Other Ambulatory Visit: Payer: Self-pay | Admitting: Nurse Practitioner

## 2018-06-15 DIAGNOSIS — I10 Essential (primary) hypertension: Secondary | ICD-10-CM

## 2018-07-30 ENCOUNTER — Other Ambulatory Visit: Payer: Self-pay | Admitting: *Deleted

## 2018-07-30 ENCOUNTER — Telehealth: Payer: Self-pay | Admitting: Nurse Practitioner

## 2018-07-30 DIAGNOSIS — R609 Edema, unspecified: Secondary | ICD-10-CM

## 2018-07-30 DIAGNOSIS — E785 Hyperlipidemia, unspecified: Secondary | ICD-10-CM

## 2018-07-30 DIAGNOSIS — I10 Essential (primary) hypertension: Secondary | ICD-10-CM

## 2018-07-30 MED ORDER — VALSARTAN 80 MG PO TABS: 80.0000 mg | ORAL_TABLET | Freq: Every day | ORAL | 1 refills | Status: DC

## 2018-07-30 MED ORDER — ATORVASTATIN CALCIUM 10 MG PO TABS: 10.0000 mg | ORAL_TABLET | Freq: Every day | ORAL | 1 refills | Status: DC

## 2018-07-30 MED ORDER — FUROSEMIDE 20 MG PO TABS: 20.0000 mg | ORAL_TABLET | Freq: Every day | ORAL | 1 refills | Status: DC

## 2018-07-30 NOTE — Telephone Encounter (Signed)
Copied from CRM [id number]. Topic: Quick Communication - Rx Refill/Question >> Jul 30, 2018  2:35 PM Floria Raveling A wrote: Medication: atorvastatin (LIPITOR) 10 MG tablet [[id number]] - 90 day supply    Has the patient contacted their pharmacy? No. (Agent: If no, request that the patient contact the pharmacy for the refill.) (Agent: If yes, when and what did the pharmacy advise?)  Preferred Pharmacy (with phone number or street name):  [id number] - [place], [place] - [address] AT [facility] [address] & [address] [phone] (Phone)  Agent: Please be advised that RX refills may take up to 3 business days. We ask that you follow-up with your pharmacy.

## 2018-07-30 NOTE — Telephone Encounter (Signed)
Refills sent. See meds. Pt informed. 

## 2018-10-20 ENCOUNTER — Ambulatory Visit: Payer: Self-pay

## 2018-10-20 ENCOUNTER — Other Ambulatory Visit: Payer: Self-pay

## 2018-10-20 ENCOUNTER — Ambulatory Visit (INDEPENDENT_AMBULATORY_CARE_PROVIDER_SITE_OTHER): Payer: Medicare Other | Admitting: Orthopaedic Surgery

## 2018-10-20 DIAGNOSIS — M25562 Pain in left knee: Secondary | ICD-10-CM | POA: Diagnosis not present

## 2018-10-20 MED ORDER — LIDOCAINE HCL 1 % IJ SOLN
2.0000 mL | INTRAMUSCULAR | Status: AC | PRN
  Administered 2018-10-20: 2 mL

## 2018-10-20 MED ORDER — BUPIVACAINE HCL 0.5 % IJ SOLN
2.0000 mL | INTRAMUSCULAR | Status: AC | PRN
  Administered 2018-10-20: 2 mL via INTRA_ARTICULAR

## 2018-10-20 MED ORDER — METHYLPREDNISOLONE ACETATE 40 MG/ML IJ SUSP
40.0000 mg | INTRAMUSCULAR | Status: AC | PRN
  Administered 2018-10-20: 18:00:00 40 mg via INTRA_ARTICULAR

## 2018-10-20 NOTE — Progress Notes (Signed)
Office Visit Note   Patient: Christine Pitts           Date of Birth: 09/11/1950           MRN: [id number] Visit Date: 10/20/2018              Requested by: Evaristo BuryShambley, Ashleigh N, NP No address on file PCP: Evaristo BuryShambley, Ashleigh N, NP   Assessment & Plan: Visit Diagnoses:  1. Acute pain of left knee     Plan: Impression is acute left knee pain secondary to overuse, degenerative medial meniscal tear, stress fracture, OA exacerbation.  We have injected cortisone today which patient tolerated well.  She will see how she responds to this injection if she does not notice any improvement we will obtain an MRI to rule out stress fracture.  In the meantime she is to take it easy for the next 2 to 4 weeks.  Follow-Up Instructions: Return if symptoms worsen or fail to improve.   Orders:  Orders Placed This Encounter  Procedures  . XR KNEE 3 VIEW LEFT   No orders of the defined types were placed in this encounter.     Procedures: Large Joint Inj: L knee on 10/20/2018 5:38 PM Details: 22 G needle Medications: 2 mL bupivacaine 0.5 %; 2 mL lidocaine 1 %; 40 mg methylPREDNISolone acetate 40 MG/ML Outcome: tolerated well, no immediate complications Patient was prepped and draped in the usual sterile fashion.       Clinical Data: No additional findings.   Subjective: Chief Complaint  Patient presents with  . Left Leg - Follow-up    Christine Pitts is a 68 year old female comes in for evaluation of left knee pain.  She states that she recently had an increase in activity in which she walked 11 miles for a church event.  She states that she has constant throbbing aching pain with start up stiffness.  Denies any significant swelling.  Denies any mechanical symptoms.  She her pain is more so on the medial side of the knee.  Denies any giving way.  She has been using topical over-the-counter creams.   Review of Systems  Constitutional: Negative.   HENT: Negative.   Eyes: Negative.    Respiratory: Negative.   Cardiovascular: Negative.   Endocrine: Negative.   Musculoskeletal: Negative.   Neurological: Negative.   Hematological: Negative.   Psychiatric/Behavioral: Negative.   All other systems reviewed and are negative.    Objective: Vital Signs: There were no vitals taken for this visit.  Physical Exam Vitals signs and nursing note reviewed.  Constitutional:      Appearance: She is well-developed.  HENT:     Head: Normocephalic and atraumatic.  Neck:     Musculoskeletal: Neck supple.  Pulmonary:     Effort: Pulmonary effort is normal.  Abdominal:     Palpations: Abdomen is soft.  Skin:    General: Skin is warm.     Capillary Refill: Capillary refill takes less than 2 seconds.  Neurological:     Mental Status: She is alert and oriented to person, place, and time.  Psychiatric:        Behavior: Behavior normal.        Thought Content: Thought content normal.        Judgment: Judgment normal.     Ortho Exam Left knee exam shows questionable trace effusion.  Collaterals and cruciates are stable.  Medial joint line tenderness as well as tenderness along the pes anserine bursa.  Range  of motion is well-preserved. Specialty Comments:  No specialty comments available.  Imaging: Xr Knee 3 View Left  Result Date: 10/20/2018 No significant degenerative joint disease    PMFS History: Patient Active Problem List   Diagnosis Date Noted  . Routine general medical examination at a health care facility 05/17/2018  . Essential hypertension 01/15/2018  . Edema 01/15/2018   Past Medical History:  Diagnosis Date  . Arthritis   . Chicken pox   . Hypertension     Family History  Problem Relation Age of Onset  . Arthritis Mother   . Heart attack Mother   . Stroke Mother   . Hypertension Father     Past Surgical History:  Procedure Laterality Date  . CARPAL TUNNEL RELEASE Left 06/22/2017   Procedure: LEFT CARPAL TUNNEL RELEASE;  Surgeon: Leanora Cover, MD;  Location: [facility];  Service: Orthopedics;  Laterality: Left;  . TONSILLECTOMY  1962  . TUBAL LIGATION  1982   Social History   Occupational History  . Not on file  Tobacco Use  . Smoking status: Never Smoker  . Smokeless tobacco: Never Used  Substance and Sexual Activity  . Alcohol use: No    Frequency: Never  . Drug use: No  . Sexual activity: Not on file

## 2018-11-15 ENCOUNTER — Ambulatory Visit: Payer: Medicare Other | Admitting: Nurse Practitioner

## 2018-11-16 ENCOUNTER — Ambulatory Visit: Payer: Self-pay

## 2018-11-16 ENCOUNTER — Encounter: Payer: Self-pay | Admitting: Orthopaedic Surgery

## 2018-11-16 ENCOUNTER — Ambulatory Visit (INDEPENDENT_AMBULATORY_CARE_PROVIDER_SITE_OTHER): Payer: Medicare Other | Admitting: Orthopaedic Surgery

## 2018-11-16 VITALS — Ht 65.0 in | Wt 163.0 lb

## 2018-11-16 DIAGNOSIS — G8929 Other chronic pain: Secondary | ICD-10-CM

## 2018-11-16 DIAGNOSIS — M5441 Lumbago with sciatica, right side: Secondary | ICD-10-CM

## 2018-11-16 DIAGNOSIS — M25561 Pain in right knee: Secondary | ICD-10-CM

## 2018-11-16 MED ORDER — LIDOCAINE HCL 1 % IJ SOLN
2.0000 mL | INTRAMUSCULAR | Status: AC | PRN
  Administered 2018-11-16: 2 mL

## 2018-11-16 MED ORDER — PREDNISONE 10 MG (21) PO TBPK: ORAL_TABLET | ORAL | 0 refills | Status: DC

## 2018-11-16 MED ORDER — METHYLPREDNISOLONE ACETATE 40 MG/ML IJ SUSP
40.0000 mg | INTRAMUSCULAR | Status: AC | PRN
  Administered 2018-11-16: 40 mg via INTRA_ARTICULAR

## 2018-11-16 MED ORDER — BUPIVACAINE HCL 0.5 % IJ SOLN
2.0000 mL | INTRAMUSCULAR | Status: AC | PRN
  Administered 2018-11-16: 2 mL via INTRA_ARTICULAR

## 2018-11-16 NOTE — Progress Notes (Signed)
Office Visit Note   Patient: Christine Pitts           Date of Birth: 09/24/1950           MRN: [id number] Visit Date: 11/16/2018              Requested by: Christine Pitts, Ashleigh N, NP No address on file PCP: Christine Pitts, Ashleigh N, NP   Assessment & Plan: Visit Diagnoses:  1. Chronic right-sided low back pain with right-sided sciatica   2. Chronic pain of right knee     Plan: Impression is right knee osteoarthritis and lumbar radiculopathy and degenerative disc disease.  Right knee was injected with cortisone today.  For the back pain and radiculopathy we will send in prescription for prednisone Dosepak.  We will see her back as needed.  Follow-Up Instructions: Return if symptoms worsen or fail to improve.   Orders:  Orders Placed This Encounter  Procedures  . XR Lumbar Spine 2-3 Views  . XR Knee 1-2 Views Right   Meds ordered this encounter  Medications  . predniSONE (STERAPRED UNI-PAK 21 TAB) 10 MG (21) TBPK tablet    Sig: Take as directed    Dispense:  21 tablet    Refill:  0      Procedures: Large Joint Inj: R knee on 11/16/2018 12:42 PM Indications: pain Details: 22 G needle  Arthrogram: No  Medications: 40 mg methylPREDNISolone acetate 40 MG/ML; 2 mL lidocaine 1 %; 2 mL bupivacaine 0.5 % Consent was given by the patient. Patient was prepped and draped in the usual sterile fashion.       Clinical Data: No additional findings.   Subjective: Chief Complaint  Patient presents with  . Right Knee - Pain  . Lower Back - Pain    Christine Pitts is a very pleasant female who I recently saw for left knee pain who comes in for right knee pain and low back pain with radicular symptoms down the leg.  She notices some decreased strength as a result.  Her left knee is doing really well from the cortisone injection that we provided her recently.  She denies any injuries.  She would like another cortisone injection today in her right knee.   Review of Systems  Constitutional:  Negative.   HENT: Negative.   Eyes: Negative.   Respiratory: Negative.   Cardiovascular: Negative.   Endocrine: Negative.   Musculoskeletal: Negative.   Neurological: Negative.   Hematological: Negative.   Psychiatric/Behavioral: Negative.   All other systems reviewed and are negative.    Objective: Vital Signs: Ht 5\' 5"  (1.651 m)   Wt 163 lb (73.9 kg)   BMI 27.12 kg/m   Physical Exam Vitals signs and nursing note reviewed.  Constitutional:      Appearance: She is well-developed.  HENT:     Head: Normocephalic and atraumatic.  Neck:     Musculoskeletal: Neck supple.  Pulmonary:     Effort: Pulmonary effort is normal.  Abdominal:     Palpations: Abdomen is soft.  Skin:    General: Skin is warm.     Capillary Refill: Capillary refill takes less than 2 seconds.  Neurological:     Mental Status: She is alert and oriented to person, place, and time.  Psychiatric:        Behavior: Behavior normal.        Thought Content: Thought content normal.        Judgment: Judgment normal.     Ortho Exam  Right knee exam shows trace joint effusion.  Normal range of motion.  Collaterals and cruciates are stable. Right lower extremity exam shows negative sciatic tension sign.  No focal motor or sensory deficits.  Normal reflexes. Specialty Comments:  No specialty comments available.  Imaging: Xr Knee 1-2 Views Right  Result Date: 11/16/2018 Mild chronic anterior translation of tibia in relation to femur.  Moderate osteoarthritis.  Xr Lumbar Spine 2-3 Views  Result Date: 11/16/2018 Significant degenerative disc disease at L4-L5.  Mild degenerative scoliosis.  Well anterior lumbar spurring.    PMFS History: Patient Active Problem List   Diagnosis Date Noted  . Routine general medical examination at a health care facility 05/17/2018  . Essential hypertension 01/15/2018  . Edema 01/15/2018   Past Medical History:  Diagnosis Date  . Arthritis   . Chicken pox   .  Hypertension     Family History  Problem Relation Age of Onset  . Arthritis Mother   . Heart attack Mother   . Stroke Mother   . Hypertension Father     Past Surgical History:  Procedure Laterality Date  . CARPAL TUNNEL RELEASE Left 06/22/2017   Procedure: LEFT CARPAL TUNNEL RELEASE;  Surgeon: Leanora Cover, MD;  Location: [facility];  Service: Orthopedics;  Laterality: Left;  . TONSILLECTOMY  1962  . TUBAL LIGATION  1982   Social History   Occupational History  . Not on file  Tobacco Use  . Smoking status: Never Smoker  . Smokeless tobacco: Never Used  Substance and Sexual Activity  . Alcohol use: No    Frequency: Never  . Drug use: No  . Sexual activity: Not on file

## 2018-11-17 ENCOUNTER — Encounter: Payer: Self-pay | Admitting: Internal Medicine

## 2018-11-17 ENCOUNTER — Other Ambulatory Visit: Payer: Self-pay | Admitting: Obstetrics & Gynecology

## 2018-11-17 ENCOUNTER — Ambulatory Visit (INDEPENDENT_AMBULATORY_CARE_PROVIDER_SITE_OTHER): Payer: Medicare Other | Admitting: Internal Medicine

## 2018-11-17 ENCOUNTER — Ambulatory Visit: Payer: Medicare Other | Admitting: Nurse Practitioner

## 2018-11-17 ENCOUNTER — Other Ambulatory Visit: Payer: Self-pay

## 2018-11-17 VITALS — BP 116/72 | HR 71 | Temp 98.1°F | Ht 65.0 in | Wt 168.0 lb

## 2018-11-17 DIAGNOSIS — M1710 Unilateral primary osteoarthritis, unspecified knee: Secondary | ICD-10-CM | POA: Insufficient documentation

## 2018-11-17 DIAGNOSIS — R739 Hyperglycemia, unspecified: Secondary | ICD-10-CM | POA: Insufficient documentation

## 2018-11-17 DIAGNOSIS — E611 Iron deficiency: Secondary | ICD-10-CM

## 2018-11-17 DIAGNOSIS — I10 Essential (primary) hypertension: Secondary | ICD-10-CM | POA: Diagnosis not present

## 2018-11-17 DIAGNOSIS — Z1231 Encounter for screening mammogram for malignant neoplasm of breast: Secondary | ICD-10-CM

## 2018-11-17 DIAGNOSIS — Z1211 Encounter for screening for malignant neoplasm of colon: Secondary | ICD-10-CM

## 2018-11-17 DIAGNOSIS — E785 Hyperlipidemia, unspecified: Secondary | ICD-10-CM

## 2018-11-17 DIAGNOSIS — L659 Nonscarring hair loss, unspecified: Secondary | ICD-10-CM

## 2018-11-17 DIAGNOSIS — M179 Osteoarthritis of knee, unspecified: Secondary | ICD-10-CM | POA: Insufficient documentation

## 2018-11-17 DIAGNOSIS — E559 Vitamin D deficiency, unspecified: Secondary | ICD-10-CM

## 2018-11-17 DIAGNOSIS — R609 Edema, unspecified: Secondary | ICD-10-CM

## 2018-11-17 DIAGNOSIS — M170 Bilateral primary osteoarthritis of knee: Secondary | ICD-10-CM

## 2018-11-17 DIAGNOSIS — E538 Deficiency of other specified B group vitamins: Secondary | ICD-10-CM

## 2018-11-17 HISTORY — DX: Hyperlipidemia, unspecified: E78.5

## 2018-11-17 NOTE — Assessment & Plan Note (Signed)
Encouraged to take statin daily as only take a few days per wk, f/u lab next visit

## 2018-11-17 NOTE — Patient Instructions (Addendum)
You will be contacted regarding the referral for: colonoscopy and dermatology  Please continue all other medications as before, and refills have been done if requested.  Please have the pharmacy call with any other refills you may need.  Please continue your efforts at being more active, low cholesterol diet, and weight control.  You are otherwise up to date with prevention measures today.  Please keep your appointments with your specialists as you may have planned  Please return in 6 months, or sooner if needed, with Lab testing done 3-5 days before

## 2018-11-17 NOTE — Assessment & Plan Note (Signed)
stable overall by history and exam, recent data reviewed with pt, and pt to continue medical treatment as before,  to f/u any worsening symptoms or concerns  

## 2018-11-17 NOTE — Progress Notes (Signed)
Subjective:    Patient ID: Christine Pitts, female    DOB: 1951-01-21, 68 y.o.   MRN: [id number]  HPI  Here to f/u; overall doing ok,  Pt denies chest pain, increasing sob or doe, wheezing, orthopnea, PND, increased LE swelling, palpitations, dizziness or syncope.  Pt denies new neurological symptoms such as new headache, or facial or extremity weakness or numbness.  Pt denies polydipsia, polyuria, or low sugar episode.  Pt states overall good compliance with meds, mostly trying to follow appropriate diet, with wt overall stable,  but little exercise however. BP Readings from Last 3 Encounters:  11/17/18 116/72  05/17/18 130/88  02/10/18 120/86  Gained some wt due to covid gym closing Wt Readings from Last 3 Encounters:  11/17/18 168 lb (76.2 kg)  11/16/18 163 lb (73.9 kg)  05/17/18 163 lb (73.9 kg)  s/p cortisone to right knee yesterday, left knee 1 mo ago per Dr Erlinda Hong.   Past Medical History:  Diagnosis Date  . Arthritis   . Chicken pox   . HLD (hyperlipidemia) 11/17/2018  . Hypertension    Past Surgical History:  Procedure Laterality Date  . CARPAL TUNNEL RELEASE Left 06/22/2017   Procedure: LEFT CARPAL TUNNEL RELEASE;  Surgeon: Leanora Cover, MD;  Location: [facility];  Service: Orthopedics;  Laterality: Left;  . TONSILLECTOMY  1962  . TUBAL LIGATION  1982    reports that she has never smoked. She has never used smokeless tobacco. She reports that she does not drink alcohol or use drugs. family history includes Arthritis in her mother; Heart attack in her mother; Hypertension in her father; Stroke in her mother. No Known Allergies Current Outpatient Medications on File Prior to Visit  Medication Sig Dispense Refill  . atorvastatin (LIPITOR) 10 MG tablet Take 1 tablet (10 mg total) by mouth daily. 90 tablet 1  . furosemide (LASIX) 20 MG tablet Take 1 tablet (20 mg total) by mouth daily. 90 tablet 1  . predniSONE (STERAPRED UNI-PAK 21 TAB) 10 MG (21) TBPK tablet Take  as directed 21 tablet 0  . valsartan (DIOVAN) 80 MG tablet Take 1 tablet (80 mg total) by mouth daily. 90 tablet 1   No current facility-administered medications on file prior to visit.    Review of Systems  Constitutional: Negative for other unusual diaphoresis or sweats HENT: Negative for ear discharge or swelling Eyes: Negative for other worsening visual disturbances Respiratory: Negative for stridor or other swelling  Gastrointestinal: Negative for worsening distension or other blood Genitourinary: Negative for retention or other urinary change Musculoskeletal: Negative for other MSK pain or swelling Skin: Negative for color change or other new lesions Neurological: Negative for worsening tremors and other numbness  Psychiatric/Behavioral: Negative for worsening agitation or other fatigue All other system neg per pt    Objective:   Physical Exam BP 116/72   Pulse 71   Temp 98.1 F (36.7 C) (Oral)   Ht 5\' 5"  (1.651 m)   Wt 168 lb (76.2 kg)   SpO2 96%   BMI 27.96 kg/m  VS noted,  Constitutional: Pt appears in NAD HENT: Head: NCAT.  Right Ear: External ear normal.  Left Ear: External ear normal.  Eyes: . Pupils are equal, round, and reactive to light. Conjunctivae and EOM are normal Nose: without d/c or deformity Neck: Neck supple. Gross normal ROM Cardiovascular: Normal rate and regular rhythm.   Pulmonary/Chest: Effort normal and breath sounds without rales or wheezing.  Abd:  Soft, NT,  ND, + BS, no organomegaly Bilat knees with degenerative changes Neurological: Pt is alert. At baseline orientation, motor grossly intact Skin: Skin is warm. No rashes, other new lesions, no LE edema Psychiatric: Pt behavior is normal without agitation  No other exam findings   declnies labs today    Assessment & Plan:

## 2018-11-17 NOTE — Assessment & Plan Note (Signed)
For tylenol prn 

## 2018-11-24 ENCOUNTER — Encounter: Payer: Self-pay | Admitting: Gastroenterology

## 2018-12-03 ENCOUNTER — Ambulatory Visit (AMBULATORY_SURGERY_CENTER): Payer: Self-pay | Admitting: *Deleted

## 2018-12-03 ENCOUNTER — Other Ambulatory Visit: Payer: Self-pay

## 2018-12-03 VITALS — Temp 96.9°F | Ht 65.5 in | Wt 168.0 lb

## 2018-12-03 DIAGNOSIS — Z1211 Encounter for screening for malignant neoplasm of colon: Secondary | ICD-10-CM

## 2018-12-03 MED ORDER — PEG 3350-KCL-NA BICARB-NACL 420 G PO SOLR: 4000.0000 mL | Freq: Once | ORAL | 0 refills | Status: AC

## 2018-12-03 NOTE — Progress Notes (Signed)
No egg or soy allergy known to patient  No issues with past sedation with any surgeries  or procedures, no intubation problems  No diet pills per patient No home 02 use per patient  No blood thinners per patient  Pt denies issues with constipation  No A fib or A flutter  EMMI video sent to pt's e mail  

## 2018-12-09 DIAGNOSIS — M8580 Other specified disorders of bone density and structure, unspecified site: Secondary | ICD-10-CM | POA: Insufficient documentation

## 2018-12-10 ENCOUNTER — Encounter: Payer: Self-pay | Admitting: Gastroenterology

## 2018-12-23 ENCOUNTER — Telehealth: Payer: Self-pay

## 2018-12-23 NOTE — Telephone Encounter (Signed)
Covid-19 screening questions   Do you now or have you had a fever in the last 14 days? NO   Do you have any respiratory symptoms of shortness of breath or cough now or in the last 14 days? NO  Do you have any family members or close contacts with diagnosed or suspected Covid-19 in the past 14 days? NO  Have you been tested for Covid-19 and found to be positive? NO        

## 2018-12-24 ENCOUNTER — Other Ambulatory Visit: Payer: Self-pay

## 2018-12-24 ENCOUNTER — Ambulatory Visit (AMBULATORY_SURGERY_CENTER): Payer: Medicare Other | Admitting: Gastroenterology

## 2018-12-24 ENCOUNTER — Encounter: Payer: Self-pay | Admitting: Gastroenterology

## 2018-12-24 VITALS — BP 109/73 | HR 76 | Temp 97.4°F | Resp 12 | Ht 65.5 in | Wt 168.0 lb

## 2018-12-24 DIAGNOSIS — Z1211 Encounter for screening for malignant neoplasm of colon: Secondary | ICD-10-CM | POA: Diagnosis not present

## 2018-12-24 MED ORDER — SODIUM CHLORIDE 0.9 % IV SOLN: 500.0000 mL | INTRAVENOUS | Status: DC

## 2018-12-24 NOTE — Op Note (Signed)
[facility] Patient Name: Christine BradfordLinda Pitts Procedure Date: 12/24/2018 10:21 AM MRN: [id number] Endoscopist: Rachael Feeaniel P Jacobs , MD Age: [age] Referring MD:  Date of Birth: 07/30/1950 Gender: Female Account #: [id number] Procedure:                Colonoscopy Indications:              Screening for colorectal malignant neoplasm Medicines:                Monitored Anesthesia Care Procedure:                Pre-Anesthesia Assessment:                           - Prior to the procedure, a History and Physical                            was performed, and patient medications and                            allergies were reviewed. The patient's tolerance of                            previous anesthesia was also reviewed. The risks                            and benefits of the procedure and the sedation                            options and risks were discussed with the patient.                            All questions were answered, and informed consent                            was obtained. Prior Anticoagulants: The patient has                            taken no previous anticoagulant or antiplatelet                            agents. ASA Grade Assessment: II - A patient with                            mild systemic disease. After reviewing the risks                            and benefits, the patient was deemed in                            satisfactory condition to undergo the procedure.                           After obtaining informed consent, the colonoscope  was passed under direct vision. Throughout the                            procedure, the patient's blood pressure, pulse, and                            oxygen saturations were monitored continuously. The                            Colonoscope was introduced through the anus and                            advanced to the the cecum, identified by                            appendiceal orifice and  ileocecal valve. The                            colonoscopy was performed without difficulty. The                            patient tolerated the procedure well. The quality                            of the bowel preparation was excellent. Scope In: 10:28:26 AM Scope Out: 10:38:55 AM Scope Withdrawal Time: 0 hours 6 minutes 23 seconds  Total Procedure Duration: 0 hours 10 minutes 29 seconds  Findings:                 Small internal hemorrhoids.                           The entire examined colon appeared normal on direct                            and retroflexion views. Complications:            No immediate complications. Estimated blood loss:                            None. Estimated Blood Loss:     Estimated blood loss: none. Impression:               - Small internal hemorrhoids.                           - The entire examined colon is normal on direct and                            retroflexion views.                           - No polyps or cancers. Recommendation:           - Patient has a contact number available for  emergencies. The signs and symptoms of potential                            delayed complications were discussed with the                            patient. Return to normal activities tomorrow.                            Written discharge instructions were provided to the                            patient.                           - Resume previous diet.                           - Continue present medications.                           - Repeat colonoscopy in 10 years for screening. Milus Banister, MD 12/24/2018 10:41:03 AM This report has been signed electronically.

## 2018-12-24 NOTE — Patient Instructions (Signed)
YOU HAD AN ENDOSCOPIC PROCEDURE TODAY AT THE [facility]:   Refer to the procedure report that was given to you for any specific questions about what was found during the examination.  If the procedure report does not answer your questions, please call your gastroenterologist to clarify.  If you requested that your care partner not be given the details of your procedure findings, then the procedure report has been included in a sealed envelope for you to review at your convenience later.  YOU SHOULD EXPECT: Some feelings of bloating in the abdomen. Passage of more gas than usual.  Walking can help get rid of the air that was put into your GI tract during the procedure and reduce the bloating. If you had a lower endoscopy (such as a colonoscopy or flexible sigmoidoscopy) you may notice spotting of blood in your stool or on the toilet paper. If you underwent a bowel prep for your procedure, you may not have a normal bowel movement for a few days.  Please Note:  You might notice some irritation and congestion in your nose or some drainage.  This is from the oxygen used during your procedure.  There is no need for concern and it should clear up in a day or so.  SYMPTOMS TO REPORT IMMEDIATELY:   Following lower endoscopy (colonoscopy or flexible sigmoidoscopy):  Excessive amounts of blood in the stool  Significant tenderness or worsening of abdominal pains  Swelling of the abdomen that is new, acute  Fever of 100F or higher   For urgent or emergent issues, a gastroenterologist can be reached at any hour by calling [phone].   DIET:  We do recommend a small meal at first, but then you may proceed to your regular diet.  Drink plenty of fluids but you should avoid alcoholic beverages for 24 hours.  MEDICATIONS: Continue present medications.  Please see handouts given to you by your recovery nurse.  ACTIVITY:  You should plan to take it easy for the rest of today and you should  NOT DRIVE or use heavy machinery until tomorrow (because of the sedation medicines used during the test).    FOLLOW UP: Our staff will call the number listed on your records 48-72 hours following your procedure to check on you and address any questions or concerns that you may have regarding the information given to you following your procedure. If we do not reach you, we will leave a message.  We will attempt to reach you two times.  During this call, we will ask if you have developed any symptoms of COVID 19. If you develop any symptoms (ie: fever, flu-like symptoms, shortness of breath, cough etc.) before then, please call [phone].  If you test positive for Covid 19 in the 2 weeks post procedure, please call and report this information to us.    If any biopsies were taken you will be contacted by phone or by letter within the next 1-3 weeks.  Please call us at [phone] if you have not heard about the biopsies in 3 weeks.   Thank you for allowing us to provide for your healthcare needs today.   SIGNATURES/CONFIDENTIALITY: You and/or your care partner have signed paperwork which will be entered into your electronic medical record.  These signatures attest to the fact that that the information above on your After Visit Summary has been reviewed and is understood.  Full responsibility of the confidentiality of this discharge information lies with you and/or   your care-partner. 

## 2018-12-24 NOTE — Progress Notes (Signed)
Pt's states no medical or surgical changes since previsit or office visit.  Vs by Mary Esther in [place] by [place] ,front desk

## 2018-12-24 NOTE — Progress Notes (Signed)
PT taken to PACU. Monitors in place. VSS. Report given to RN. 

## 2018-12-28 ENCOUNTER — Telehealth: Payer: Self-pay | Admitting: *Deleted

## 2018-12-28 NOTE — Telephone Encounter (Signed)
1. Have you developed a fever since your procedure? no  2.   Have you had an respiratory symptoms (SOB or cough) since your procedure? no  3.   Have you tested positive for COVID 19 since your procedure no  4.   Have you had any family members/close contacts diagnosed with the COVID 19 since your procedure?  no   If yes to any of these questions please route to Joylene John, RN and Alphonsa Gin, Therapist, sports.  Follow up Call-  Call back number 12/24/2018  Post procedure Call Back phone  # [phone]  Permission to leave phone message Yes  Some recent data might be hidden     Patient questions:  Do you have a fever, pain , or abdominal swelling? No. Pain Score  0 *  Have you tolerated food without any problems? Yes.    Have you been able to return to your normal activities? Yes.    Do you have any questions about your discharge instructions: Diet   No. Medications  No. Follow up visit  No.  Do you have questions or concerns about your Care? No.  Actions: * If pain score is 4 or above: No action needed, pain <4.

## 2018-12-30 ENCOUNTER — Other Ambulatory Visit: Payer: Self-pay | Admitting: Internal Medicine

## 2018-12-30 DIAGNOSIS — R609 Edema, unspecified: Secondary | ICD-10-CM

## 2018-12-30 DIAGNOSIS — E785 Hyperlipidemia, unspecified: Secondary | ICD-10-CM

## 2018-12-30 DIAGNOSIS — I10 Essential (primary) hypertension: Secondary | ICD-10-CM

## 2018-12-31 ENCOUNTER — Other Ambulatory Visit: Payer: Self-pay

## 2018-12-31 ENCOUNTER — Ambulatory Visit
Admission: RE | Admit: 2018-12-31 | Discharge: 2018-12-31 | Disposition: A | Discharge status: Home / Self Care | Payer: Medicare Other | Source: Ambulatory Visit | Attending: Obstetrics & Gynecology | Admitting: Obstetrics & Gynecology

## 2018-12-31 DIAGNOSIS — Z1231 Encounter for screening mammogram for malignant neoplasm of breast: Secondary | ICD-10-CM

## 2019-05-13 ENCOUNTER — Ambulatory Visit: Payer: Self-pay

## 2019-05-13 ENCOUNTER — Ambulatory Visit (INDEPENDENT_AMBULATORY_CARE_PROVIDER_SITE_OTHER): Payer: Medicare Other | Admitting: Orthopaedic Surgery

## 2019-05-13 ENCOUNTER — Other Ambulatory Visit: Payer: Self-pay

## 2019-05-13 DIAGNOSIS — M1712 Unilateral primary osteoarthritis, left knee: Secondary | ICD-10-CM | POA: Diagnosis not present

## 2019-05-13 DIAGNOSIS — M1711 Unilateral primary osteoarthritis, right knee: Secondary | ICD-10-CM

## 2019-05-13 MED ORDER — METHYLPREDNISOLONE ACETATE 40 MG/ML IJ SUSP
40.0000 mg | INTRAMUSCULAR | Status: AC | PRN
  Administered 2019-05-13: 40 mg via INTRA_ARTICULAR

## 2019-05-13 MED ORDER — LIDOCAINE HCL 1 % IJ SOLN
2.0000 mL | INTRAMUSCULAR | Status: AC | PRN
  Administered 2019-05-13: 2 mL

## 2019-05-13 MED ORDER — BUPIVACAINE HCL 0.5 % IJ SOLN
2.0000 mL | INTRAMUSCULAR | Status: AC | PRN
  Administered 2019-05-13: 2 mL via INTRA_ARTICULAR

## 2019-05-13 MED ORDER — CELECOXIB 200 MG PO CAPS: 200.0000 mg | ORAL_CAPSULE | Freq: Two times a day (BID) | ORAL | 3 refills | Status: DC

## 2019-05-13 NOTE — Progress Notes (Signed)
Office Visit Note   Patient: Christine Pitts           Date of Birth: 1950-09-15           MRN: [id number] Visit Date: 05/13/2019              Requested by: Biagio Borg, MD [address] [place],  [place] [address] PCP: Biagio Borg, MD   Assessment & Plan: Visit Diagnoses:  1. Primary osteoarthritis of left knee   2. Primary osteoarthritis of right knee     Plan: Impression is bilateral knee osteoarthritis slightly worse on the right.  Repeat cortisone injections performed today.  I sent in a prescription for Celebrex today.  She will check with her PCP to make sure this is okay to take since she has hypertension.  We will see her back as needed.  Follow-Up Instructions: Return if symptoms worsen or fail to improve.   Orders:  Orders Placed This Encounter  Procedures  . XR KNEE 3 VIEW RIGHT  . XR KNEE 3 VIEW LEFT   Meds ordered this encounter  Medications  . celecoxib (CELEBREX) 200 MG capsule    Sig: Take 1 capsule (200 mg total) by mouth 2 (two) times daily.    Dispense:  30 capsule    Refill:  3      Procedures: Large Joint Inj: bilateral knee on 05/13/2019 9:02 AM Indications: pain Details: 22 G needle  Arthrogram: No  Medications (Right): 2 mL lidocaine 1 %; 2 mL bupivacaine 0.5 %; 40 mg methylPREDNISolone acetate 40 MG/ML Medications (Left): 2 mL lidocaine 1 %; 2 mL bupivacaine 0.5 %; 40 mg methylPREDNISolone acetate 40 MG/ML Outcome: tolerated well, no immediate complications Patient was prepped and draped in the usual sterile fashion.       Clinical Data: No additional findings.   Subjective: Chief Complaint  Patient presents with  . Right Knee - Pain  . Left Knee - Pain    [name] is a 69 year old female who comes in for follow-up of bilateral knee osteoarthritis.  Her previous cortisone injection was done in July and August of last year did work very well.  She is requesting repeat injections today.  She is also inquiring about medications  that she can take.  Voltaren gel has not helped.   Review of Systems   Objective: Vital Signs: There were no vitals taken for this visit.  Physical Exam  Ortho Exam Bilateral knee exams show no joint effusion.  Exam is unchanged.  Moderate pain with range of motion. Specialty Comments:  No specialty comments available.  Imaging: XR KNEE 3 VIEW LEFT  Result Date: 05/13/2019 Stable osteoarthritis.  XR KNEE 3 VIEW RIGHT  Result Date: 05/13/2019 Stable osteoarthritis.    PMFS History: Patient Active Problem List   Diagnosis Date Noted  . Primary osteoarthritis of left knee 05/13/2019  . Primary osteoarthritis of right knee 05/13/2019  . HLD (hyperlipidemia) 11/17/2018  . Hyperglycemia 11/17/2018  . Arthritis of knee, degenerative 11/17/2018  . Routine general medical examination at a health care facility 05/17/2018  . Essential hypertension 01/15/2018  . Edema 01/15/2018   Past Medical History:  Diagnosis Date  . Anemia    past hx of anemia  . Arthritis   . Chicken pox   . HLD (hyperlipidemia) 11/17/2018  . Hypertension     Family History  Problem Relation Age of Onset  . Arthritis Mother   . Heart attack Mother   . Stroke  Mother   . Hypertension Father   . Colon cancer Neg Hx   . Colon polyps Neg Hx   . Esophageal cancer Neg Hx   . Rectal cancer Neg Hx   . Stomach cancer Neg Hx   . Breast cancer Neg Hx     Past Surgical History:  Procedure Laterality Date  . CARPAL TUNNEL RELEASE Left 06/22/2017   Procedure: LEFT CARPAL TUNNEL RELEASE;  Surgeon: Betha Loa, MD;  Location: [facility];  Service: Orthopedics;  Laterality: Left;  . COLONOSCOPY     10 + yrs ago was normal per pt   . TONSILLECTOMY  1962  . TUBAL LIGATION  1982   Social History   Occupational History  . Not on file  Tobacco Use  . Smoking status: Never Smoker  . Smokeless tobacco: Never Used  Substance and Sexual Activity  . Alcohol use: No  . Drug use: No  . Sexual  activity: Not on file

## 2019-05-17 ENCOUNTER — Telehealth: Payer: Self-pay

## 2019-05-17 NOTE — Telephone Encounter (Signed)
Yes that's fine 

## 2019-05-17 NOTE — Telephone Encounter (Signed)
Patient called stated she needed a note saying it was ok for her to get her COVID vaccine since she had an injection with Dr Roda Shutters recently. She said that she had discussed this with him at her OV? Please call her to further advise. [phone]

## 2019-05-18 NOTE — Telephone Encounter (Signed)
Ready for pick up. Called patient no answer. LMOM.  

## 2019-05-24 ENCOUNTER — Other Ambulatory Visit: Payer: Self-pay | Admitting: Internal Medicine

## 2019-05-24 ENCOUNTER — Encounter: Payer: Self-pay | Admitting: Internal Medicine

## 2019-05-24 ENCOUNTER — Ambulatory Visit (INDEPENDENT_AMBULATORY_CARE_PROVIDER_SITE_OTHER): Payer: Medicare Other | Admitting: Internal Medicine

## 2019-05-24 ENCOUNTER — Other Ambulatory Visit: Payer: Self-pay

## 2019-05-24 VITALS — BP 134/72 | HR 74 | Temp 98.0°F | Ht 65.5 in | Wt 162.0 lb

## 2019-05-24 DIAGNOSIS — Z0000 Encounter for general adult medical examination without abnormal findings: Secondary | ICD-10-CM

## 2019-05-24 DIAGNOSIS — E538 Deficiency of other specified B group vitamins: Secondary | ICD-10-CM | POA: Diagnosis not present

## 2019-05-24 DIAGNOSIS — R739 Hyperglycemia, unspecified: Secondary | ICD-10-CM | POA: Diagnosis not present

## 2019-05-24 DIAGNOSIS — E559 Vitamin D deficiency, unspecified: Secondary | ICD-10-CM

## 2019-05-24 DIAGNOSIS — E611 Iron deficiency: Secondary | ICD-10-CM | POA: Diagnosis not present

## 2019-05-24 LAB — IBC PANEL
Iron: 49 ug/dL (ref 42–145)
Saturation Ratios: 14.2 % — ABNORMAL LOW (ref 20.0–50.0)
Transferrin: 246 mg/dL (ref 212.0–360.0)

## 2019-05-24 LAB — CBC WITH DIFFERENTIAL/PLATELET
Basophils Absolute: 0 10*3/uL (ref 0.0–0.1)
Basophils Relative: 0.6 % (ref 0.0–3.0)
Eosinophils Absolute: 0.1 10*3/uL (ref 0.0–0.7)
Eosinophils Relative: 2 % (ref 0.0–5.0)
HCT: 38.1 % (ref 36.0–46.0)
Hemoglobin: 12.5 g/dL (ref 12.0–15.0)
Lymphocytes Relative: 15.5 % (ref 12.0–46.0)
Lymphs Abs: 0.9 10*3/uL (ref 0.7–4.0)
MCHC: 32.9 g/dL (ref 30.0–36.0)
MCV: 97.4 fl (ref 78.0–100.0)
Monocytes Absolute: 0.3 10*3/uL (ref 0.1–1.0)
Monocytes Relative: 4.3 % (ref 3.0–12.0)
Neutro Abs: 4.6 10*3/uL (ref 1.4–7.7)
Neutrophils Relative %: 77.6 % — ABNORMAL HIGH (ref 43.0–77.0)
Platelets: 357 10*3/uL (ref 150.0–400.0)
RBC: 3.91 Mil/uL (ref 3.87–5.11)
RDW: 13.7 % (ref 11.5–15.5)
WBC: 5.9 10*3/uL (ref 4.0–10.5)

## 2019-05-24 LAB — LIPID PANEL
Cholesterol: 216 mg/dL — ABNORMAL HIGH (ref 0–200)
HDL: 92 mg/dL (ref >39.00)
LDL Cholesterol: 114 mg/dL — ABNORMAL HIGH (ref 0–99)
NonHDL: 123.59
Total CHOL/HDL Ratio: 2
Triglycerides: 50 mg/dL (ref 0.0–149.0)
VLDL: 10 mg/dL (ref 0.0–40.0)

## 2019-05-24 LAB — HEPATIC FUNCTION PANEL
ALT: 13 U/L (ref 0–35)
AST: 16 U/L (ref 0–37)
Albumin: 4.5 g/dL (ref 3.5–5.2)
Alkaline Phosphatase: 117 U/L (ref 39–117)
Bilirubin, Direct: 0.1 mg/dL (ref 0.0–0.3)
Total Bilirubin: 0.4 mg/dL (ref 0.2–1.2)
Total Protein: 7.5 g/dL (ref 6.0–8.3)

## 2019-05-24 LAB — BASIC METABOLIC PANEL
BUN: 25 mg/dL — ABNORMAL HIGH (ref 6–23)
CO2: 25 mEq/L (ref 19–32)
Calcium: 9.5 mg/dL (ref 8.4–10.5)
Chloride: 105 mEq/L (ref 96–112)
Creatinine, Ser: 0.74 mg/dL (ref 0.40–1.20)
GFR: 94.33 mL/min (ref >60.00)
Glucose, Bld: 91 mg/dL (ref 70–99)
Potassium: 3.8 mEq/L (ref 3.5–5.1)
Sodium: 139 mEq/L (ref 135–145)

## 2019-05-24 LAB — TSH: TSH: 1.78 u[IU]/mL (ref 0.35–4.50)

## 2019-05-24 LAB — URINALYSIS, ROUTINE W REFLEX MICROSCOPIC
Bilirubin Urine: NEGATIVE
Hgb urine dipstick: NEGATIVE
Ketones, ur: NEGATIVE
Leukocytes,Ua: NEGATIVE
Nitrite: NEGATIVE
RBC / HPF: NONE SEEN (ref 0–?)
Specific Gravity, Urine: >=1.03 — AB (ref 1.000–1.030)
Total Protein, Urine: NEGATIVE
Urine Glucose: NEGATIVE
Urobilinogen, UA: 0.2 (ref 0.0–1.0)
pH: 5.5 (ref 5.0–8.0)

## 2019-05-24 LAB — VITAMIN B12: Vitamin B-12: 423 pg/mL (ref 211–911)

## 2019-05-24 LAB — VITAMIN D 25 HYDROXY (VIT D DEFICIENCY, FRACTURES): VITD: 15.01 ng/mL — ABNORMAL LOW (ref 30.00–100.00)

## 2019-05-24 LAB — HEMOGLOBIN A1C: Hgb A1c MFr Bld: 5.8 % (ref 4.6–6.5)

## 2019-05-24 MED ORDER — VITAMIN D (ERGOCALCIFEROL) 1.25 MG (50000 UNIT) PO CAPS: 50000.0000 [IU] | ORAL_CAPSULE | ORAL | 0 refills | Status: DC

## 2019-05-24 MED ORDER — TRAMADOL HCL 50 MG PO TABS: 50.0000 mg | ORAL_TABLET | Freq: Four times a day (QID) | ORAL | 0 refills | Status: DC | PRN

## 2019-05-24 NOTE — Patient Instructions (Addendum)
Please take all new medication as prescribed - the tramadol for pain  Please continue all other medications as before, and refills have been done if requested.  Please have the pharmacy call with any other refills you may need.  Please continue your efforts at being more active, low cholesterol diet, and weight control.  You are otherwise up to date with prevention measures today.  Please keep your appointments with your specialists as you may have planned  Please go to the LAB at the blood drawing area for the tests to be done  You will be contacted by phone if any changes need to be made immediately.  Otherwise, you will receive a letter about your results with an explanation, but please check with MyChart first.  Please remember to sign up for MyChart if you have not done so, as this will be important to you in the future with finding out test results, communicating by private email, and scheduling acute appointments online when needed.  Please make an Appointment to return in 6 months, or sooner if needed

## 2019-05-24 NOTE — Assessment & Plan Note (Signed)

## 2019-05-24 NOTE — Progress Notes (Signed)
Subjective:    Patient ID: Christine Pitts, female    DOB: 17-Nov-1950, 69 y.o.   MRN: [id number]  HPI Here for wellness and f/u;  Overall doing ok;  Pt denies Chest pain, worsening SOB, DOE, wheezing, orthopnea, PND, worsening LE edema, palpitations, dizziness or syncope.  Pt denies neurological change such as new headache, facial or extremity weakness.  Pt denies polydipsia, polyuria, or low sugar symptoms. Pt states overall good compliance with treatment and medications, good tolerability, and has been trying to follow appropriate diet.  Pt denies worsening depressive symptoms, suicidal ideation or panic. No fever, night sweats, wt loss, loss of appetite, or other constitutional symptoms.  Pt states good ability with ADL's, has low fall risk, home safety reviewed and adequate, no other significant changes in hearing or vision, and only occasionally active with exercise. Has appt for covid shot #1 later today.   Past Medical History:  Diagnosis Date  . Anemia    past hx of anemia  . Arthritis   . Chicken pox   . HLD (hyperlipidemia) 11/17/2018  . Hypertension    Past Surgical History:  Procedure Laterality Date  . CARPAL TUNNEL RELEASE Left 06/22/2017   Procedure: LEFT CARPAL TUNNEL RELEASE;  Surgeon: Leanora Cover, MD;  Location: [facility];  Service: Orthopedics;  Laterality: Left;  . COLONOSCOPY     10 + yrs ago was normal per pt   . TONSILLECTOMY  1962  . TUBAL LIGATION  1982    reports that she has never smoked. She has never used smokeless tobacco. She reports that she does not drink alcohol or use drugs. family history includes Arthritis in her mother; Heart attack in her mother; Hypertension in her father; Stroke in her mother. No Known Allergies Current Outpatient Medications on File Prior to Visit  Medication Sig Dispense Refill  . atorvastatin (LIPITOR) 10 MG tablet TAKE 1 TABLET(10 MG) BY MOUTH DAILY 90 tablet 1  . bisacodyl (DULCOLAX) 5 MG EC tablet Take 5 mg  by mouth once. For golytely prep x 4 for colon 9-25    . celecoxib (CELEBREX) 200 MG capsule Take 1 capsule (200 mg total) by mouth 2 (two) times daily. 30 capsule 3  . furosemide (LASIX) 20 MG tablet TAKE 1 TABLET(20 MG) BY MOUTH DAILY 90 tablet 1  . valsartan (DIOVAN) 80 MG tablet TAKE 1 TABLET(80 MG) BY MOUTH DAILY 90 tablet 1  . predniSONE (STERAPRED UNI-PAK 21 TAB) 10 MG (21) TBPK tablet Take as directed (Patient not taking: Reported on 05/24/2019) 21 tablet 0   Current Facility-Administered Medications on File Prior to Visit  Medication Dose Route Frequency Provider Last Rate Last Admin  . 0.9 %  sodium chloride infusion  500 mL Intravenous Continuous Milus Banister, MD       Review of Systems All otherwise neg per pt     Objective:   Physical Exam BP 134/72   Pulse 74   Temp 98 F (36.7 C)   Ht 5' 5.5" (1.664 m)   Wt 162 lb (73.5 kg)   SpO2 99%   BMI 26.55 kg/m  VS noted,  Constitutional: Pt appears in NAD HENT: Head: NCAT.  Right Ear: External ear normal.  Left Ear: External ear normal.  Eyes: . Pupils are equal, round, and reactive to light. Conjunctivae and EOM are normal Nose: without d/c or deformity Neck: Neck supple. Gross normal ROM Cardiovascular: Normal rate and regular rhythm.   Pulmonary/Chest: Effort normal and breath  sounds without rales or wheezing.  Abd:  Soft, NT, ND, + BS, no organomegaly Neurological: Pt is alert. At baseline orientation, motor grossly intact Skin: Skin is warm. No rashes, other new lesions, no LE edema Psychiatric: Pt behavior is normal without agitation  All otherwise neg per pt  Lab Results  Component Value Date   WBC 5.9 05/24/2019   HGB 12.5 05/24/2019   HCT 38.1 05/24/2019   PLT 357.0 05/24/2019   GLUCOSE 91 05/24/2019   CHOL 216 (H) 05/24/2019   TRIG 50.0 05/24/2019   HDL 92.00 05/24/2019   LDLCALC 114 (H) 05/24/2019   ALT 13 05/24/2019   AST 16 05/24/2019   NA 139 05/24/2019   K 3.8 05/24/2019   CL 105  05/24/2019   CREATININE 0.74 05/24/2019   BUN 25 (H) 05/24/2019   CO2 25 05/24/2019   TSH 1.78 05/24/2019   HGBA1C 5.8 05/24/2019       Assessment & Plan:

## 2019-05-24 NOTE — Assessment & Plan Note (Signed)
stable overall by history and exam, recent data reviewed with pt, and pt to continue medical treatment as before,  to f/u any worsening symptoms or concerns  

## 2019-05-25 ENCOUNTER — Encounter: Payer: Self-pay | Admitting: Orthopaedic Surgery

## 2019-05-25 ENCOUNTER — Ambulatory Visit (INDEPENDENT_AMBULATORY_CARE_PROVIDER_SITE_OTHER): Payer: Medicare Other | Admitting: Orthopaedic Surgery

## 2019-05-25 ENCOUNTER — Telehealth: Payer: Self-pay

## 2019-05-25 VITALS — Ht 65.5 in | Wt 162.0 lb

## 2019-05-25 DIAGNOSIS — M1712 Unilateral primary osteoarthritis, left knee: Secondary | ICD-10-CM

## 2019-05-25 NOTE — Telephone Encounter (Signed)
Please get precert for left knee visco injections. This is Dr.Xu's patient. Thanks!

## 2019-05-25 NOTE — Progress Notes (Signed)
Office Visit Note   Patient: Christine Pitts           Date of Birth: [birth year]           MRN: [id number] Visit Date: 05/25/2019              Requested by: Biagio Borg, MD [address] [place],  [place] [address] PCP: Biagio Borg, MD   Assessment & Plan: Visit Diagnoses:  1. Unilateral primary osteoarthritis, left knee     Plan: Impression is left knee arthritis.  At this point, will obtain approval for viscosupplementation injection.  Follow-up with [place] once has been approved. This patient is diagnosed with osteoarthritis of the knee(s).    Radiographs show evidence of joint space narrowing, osteophytes, subchondral sclerosis and/or subchondral cysts.  This patient has knee pain which interferes with functional and activities of daily living.    This patient has experienced inadequate response, adverse effects and/or intolerance with conservative treatments such as acetaminophen, NSAIDS, topical creams, physical therapy or regular exercise, knee bracing and/or weight loss.   This patient has experienced inadequate response or has a contraindication to intra articular steroid injections for at least 3 months.   This patient is not scheduled to have a total knee replacement within 6 months of starting treatment with viscosupplementation.   Follow-Up Instructions: Return for once approved for left knee visco inj.   Orders:  No orders of the defined types were placed in this encounter.  No orders of the defined types were placed in this encounter.     Procedures: No procedures performed   Clinical Data: No additional findings.   Subjective: Chief Complaint  Patient presents with  . Left Knee - Pain    HPI patient is a pleasant 69 year old female who comes in today with recurrent left knee pain.  She was seen in our office approximately 2 weeks ago for bilateral knee arthritis.  Both knees were injected with cortisone.  She had significant relief from the right  knee injection.  The left knee injection helped for about 1 day.  Her pain has returned and has started to worsen.  Majority of her pain is to the posterior medial aspect.  Worse going from a seated to standing position.  She has been taking over-the-counter pain medication without relief of symptoms.  No previous viscosupplementation injection to either knee.     Objective: Vital Signs: Ht 5' 5.5" (1.664 m)   Wt 162 lb (73.5 kg)   BMI 26.55 kg/m     Ortho Exam examination of left knee shows a 1+ effusion.  Range of motion 0 to 95 degrees.  Medial joint line tenderness.  Calf is soft nontender.  She is neurovascular intact distally.  Specialty Comments:  No specialty comments available.  Imaging: No new imaging   PMFS History: Patient Active Problem List   Diagnosis Date Noted  . Primary osteoarthritis of left knee 05/13/2019  . Primary osteoarthritis of right knee 05/13/2019  . HLD (hyperlipidemia) 11/17/2018  . Hyperglycemia 11/17/2018  . Arthritis of knee, degenerative 11/17/2018  . Routine general medical examination at a health care facility 05/17/2018  . Essential hypertension 01/15/2018  . Edema 01/15/2018   Past Medical History:  Diagnosis Date  . Anemia    past hx of anemia  . Arthritis   . Chicken pox   . HLD (hyperlipidemia) 11/17/2018  . Hypertension     Family History  Problem Relation Age of Onset  .  Arthritis Mother   . Heart attack Mother   . Stroke Mother   . Hypertension Father   . Colon cancer Neg Hx   . Colon polyps Neg Hx   . Esophageal cancer Neg Hx   . Rectal cancer Neg Hx   . Stomach cancer Neg Hx   . Breast cancer Neg Hx     Past Surgical History:  Procedure Laterality Date  . CARPAL TUNNEL RELEASE Left 06/22/2017   Procedure: LEFT CARPAL TUNNEL RELEASE;  Surgeon: Betha Loa, MD;  Location: [facility];  Service: Orthopedics;  Laterality: Left;  . COLONOSCOPY     10 + yrs ago was normal per pt   . TONSILLECTOMY  1962   . TUBAL LIGATION  1982   Social History   Occupational History  . Not on file  Tobacco Use  . Smoking status: Never Smoker  . Smokeless tobacco: Never Used  Substance and Sexual Activity  . Alcohol use: No  . Drug use: No  . Sexual activity: Not on file

## 2019-05-26 ENCOUNTER — Telehealth: Payer: Self-pay

## 2019-05-26 NOTE — Telephone Encounter (Signed)
I spoke with the pt and informed her of approval and copay. She stated understanding. Appointment set for Monday @ 8:15am

## 2019-05-26 NOTE — Telephone Encounter (Signed)
Submitted for VOB for Synvisc one- Left knee  

## 2019-05-26 NOTE — Telephone Encounter (Signed)
Lvm for pt to call back to schedule appointment  

## 2019-05-26 NOTE — Telephone Encounter (Signed)
Approved for Synvisc One- Left knee Dr. Tressie Stalker and Bill $30 copay 20% OOP No prior auth required

## 2019-05-30 ENCOUNTER — Ambulatory Visit: Payer: Medicare Other | Admitting: Orthopaedic Surgery

## 2019-05-30 ENCOUNTER — Other Ambulatory Visit: Payer: Self-pay

## 2019-05-30 DIAGNOSIS — M1712 Unilateral primary osteoarthritis, left knee: Secondary | ICD-10-CM | POA: Diagnosis not present

## 2019-05-30 MED ORDER — HYLAN G-F 20 48 MG/6ML IX SOSY
48.0000 mg | PREFILLED_SYRINGE | INTRA_ARTICULAR | Status: AC | PRN
  Administered 2019-05-30: 48 mg via INTRA_ARTICULAR

## 2019-05-30 NOTE — Progress Notes (Signed)
   Procedure Note  Patient: Christine Pitts             Date of Birth: 04/16/1950           MRN: [id number]             Visit Date: 05/30/2019  Procedures: Visit Diagnoses:  1. Primary osteoarthritis of left knee     Large Joint Inj: L knee on 05/30/2019 8:11 AM Indications: pain Details: 22 G needle  Arthrogram: No  Medications: 48 mg Hylan 48 MG/6ML Outcome: tolerated well, no immediate complications Patient was prepped and draped in the usual sterile fashion.

## 2019-06-09 ENCOUNTER — Other Ambulatory Visit: Payer: Self-pay | Admitting: Internal Medicine

## 2019-06-09 DIAGNOSIS — I10 Essential (primary) hypertension: Secondary | ICD-10-CM

## 2019-06-09 DIAGNOSIS — R609 Edema, unspecified: Secondary | ICD-10-CM

## 2019-06-09 DIAGNOSIS — E785 Hyperlipidemia, unspecified: Secondary | ICD-10-CM

## 2019-06-12 ENCOUNTER — Other Ambulatory Visit: Payer: Self-pay | Admitting: Internal Medicine

## 2019-06-12 NOTE — Telephone Encounter (Signed)
Done erx 

## 2019-06-27 ENCOUNTER — Telehealth: Payer: Self-pay | Admitting: Orthopaedic Surgery

## 2019-06-27 NOTE — Telephone Encounter (Signed)
Patient called this morning stating that she is still having trouble with her leg.  Patient received a Synvisc One Injection on March 1st.  CB#[phone].  Thank you.

## 2019-06-27 NOTE — Telephone Encounter (Signed)
Patient aware.

## 2019-06-27 NOTE — Telephone Encounter (Signed)
Can take up to 6 weeks to kick in.  If no relief at that point, than shot not likely to help at all

## 2019-08-30 ENCOUNTER — Other Ambulatory Visit: Payer: Self-pay | Admitting: Internal Medicine

## 2019-09-13 ENCOUNTER — Ambulatory Visit (INDEPENDENT_AMBULATORY_CARE_PROVIDER_SITE_OTHER): Payer: Medicare Other | Admitting: Orthopaedic Surgery

## 2019-09-13 ENCOUNTER — Encounter: Payer: Self-pay | Admitting: Orthopaedic Surgery

## 2019-09-13 DIAGNOSIS — M1712 Unilateral primary osteoarthritis, left knee: Secondary | ICD-10-CM | POA: Diagnosis not present

## 2019-09-13 MED ORDER — METHYLPREDNISOLONE ACETATE 40 MG/ML IJ SUSP
40.0000 mg | INTRAMUSCULAR | Status: AC | PRN
  Administered 2019-09-13: 40 mg via INTRA_ARTICULAR

## 2019-09-13 MED ORDER — BUPIVACAINE HCL 0.5 % IJ SOLN
2.0000 mL | INTRAMUSCULAR | Status: AC | PRN
  Administered 2019-09-13: 2 mL via INTRA_ARTICULAR

## 2019-09-13 MED ORDER — LIDOCAINE HCL 1 % IJ SOLN
2.0000 mL | INTRAMUSCULAR | Status: AC | PRN
  Administered 2019-09-13: 2 mL

## 2019-09-13 NOTE — Progress Notes (Signed)
Mri le

## 2019-09-13 NOTE — Progress Notes (Signed)
Office Visit Note   Patient: Christine Pitts           Date of Birth: 1950-12-01           MRN: [id number] Visit Date: 09/13/2019              Requested by: Corwin Levins, MD [address] [place],  [place] [address] PCP: Corwin Levins, MD   Assessment & Plan: Visit Diagnoses:  1. Primary osteoarthritis of left knee     Plan: Impression is left knee DJD and effusion with partial relief from Visco injection.  We performed a left knee aspiration and injection of cortisone today.  We obtained 45 cc of joint fluid.  We will also obtain MRI to rule out structural abnormalities given continued symptoms.  Follow-up after the MRI.  Follow-Up Instructions: No follow-ups on file.   Orders:  No orders of the defined types were placed in this encounter.  No orders of the defined types were placed in this encounter.     Procedures: Large Joint Inj: L knee on 09/13/2019 8:40 AM Details: 22 G needle Medications: 2 mL bupivacaine 0.5 %; 2 mL lidocaine 1 %; 40 mg methylPREDNISolone acetate 40 MG/ML Outcome: tolerated well, no immediate complications Patient was prepped and draped in the usual sterile fashion.       Clinical Data: No additional findings.   Subjective: Chief Complaint  Patient presents with  . Left Knee - Pain    Christine Pitts is a 69 year old female who is following up today for continued left knee pain with swelling.  The previous Synvisc injection provided her with some partial relief.  She states that she continues to have swelling and fluid in her knee joint that is very bothersome functionally limiting.  Denies any mechanical symptoms.   Review of Systems   Objective: Vital Signs: There were no vitals taken for this visit.  Physical Exam  Ortho Exam Left knee shows a small joint effusion.  No signs of infection.  Slight limitation in range of motion. Specialty Comments:  No specialty comments available.  Imaging: No results found.   PMFS  History: Patient Active Problem List   Diagnosis Date Noted  . Primary osteoarthritis of left knee 05/13/2019  . Primary osteoarthritis of right knee 05/13/2019  . HLD (hyperlipidemia) 11/17/2018  . Hyperglycemia 11/17/2018  . Arthritis of knee, degenerative 11/17/2018  . Routine general medical examination at a health care facility 05/17/2018  . Essential hypertension 01/15/2018  . Edema 01/15/2018   Past Medical History:  Diagnosis Date  . Anemia    past hx of anemia  . Arthritis   . Chicken pox   . HLD (hyperlipidemia) 11/17/2018  . Hypertension     Family History  Problem Relation Age of Onset  . Arthritis Mother   . Heart attack Mother   . Stroke Mother   . Hypertension Father   . Colon cancer Neg Hx   . Colon polyps Neg Hx   . Esophageal cancer Neg Hx   . Rectal cancer Neg Hx   . Stomach cancer Neg Hx   . Breast cancer Neg Hx     Past Surgical History:  Procedure Laterality Date  . CARPAL TUNNEL RELEASE Left 06/22/2017   Procedure: LEFT CARPAL TUNNEL RELEASE;  Surgeon: Betha Loa, MD;  Location: [facility];  Service: Orthopedics;  Laterality: Left;  . COLONOSCOPY     10 + yrs ago was normal per pt   .  TONSILLECTOMY  1962  . TUBAL LIGATION  1982   Social History   Occupational History  . Not on file  Tobacco Use  . Smoking status: Never Smoker  . Smokeless tobacco: Never Used  Vaping Use  . Vaping Use: Never used  Substance and Sexual Activity  . Alcohol use: No  . Drug use: No  . Sexual activity: Not on file

## 2019-10-15 ENCOUNTER — Ambulatory Visit
Admission: RE | Admit: 2019-10-15 | Discharge: 2019-10-15 | Disposition: A | Discharge status: Home / Self Care | Payer: Medicare Other | Source: Ambulatory Visit | Attending: Orthopaedic Surgery | Admitting: Orthopaedic Surgery

## 2019-10-15 ENCOUNTER — Other Ambulatory Visit: Payer: Self-pay

## 2019-10-15 DIAGNOSIS — M1712 Unilateral primary osteoarthritis, left knee: Secondary | ICD-10-CM

## 2019-10-20 ENCOUNTER — Ambulatory Visit (INDEPENDENT_AMBULATORY_CARE_PROVIDER_SITE_OTHER): Payer: Medicare Other | Admitting: Orthopaedic Surgery

## 2019-10-20 ENCOUNTER — Encounter: Payer: Self-pay | Admitting: Orthopaedic Surgery

## 2019-10-20 DIAGNOSIS — M25569 Pain in unspecified knee: Secondary | ICD-10-CM

## 2019-10-20 DIAGNOSIS — M25562 Pain in left knee: Secondary | ICD-10-CM | POA: Diagnosis not present

## 2019-10-23 DIAGNOSIS — M93262 Osteochondritis dissecans, left knee: Secondary | ICD-10-CM | POA: Insufficient documentation

## 2019-10-23 NOTE — Progress Notes (Signed)
Office Visit Note   Patient: Christine Pitts           Date of Birth: 05-Jul-1950           MRN: [id number] Visit Date: 10/20/2019              Requested by: Corwin Levins, MD [address] [place],  [place] [address] PCP: Corwin Levins, MD   Assessment & Plan: Visit Diagnoses:  1. 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  She also has a large radial tear of the posterior horn with meniscal extrusion.  She has moderate degenerative chondrosis throughout her knee.  These findings were reviewed with the patient and recommendation is for arthroscopic debridement of the OCD with abrasion arthroplasty versus microfracture and partial medial meniscectomy.  Risk benefits rehab recovery alternatives all discussed in detail.  Patient would like to have this surgery as soon as possible.  Follow-Up Instructions: Return if symptoms worsen or fail to improve.   Orders:  No orders of the defined types were placed in this encounter.  No orders of the defined types were placed in this encounter.     Procedures: No procedures performed   Clinical Data: No additional findings.   Subjective: Chief Complaint  Patient presents with  . Left Knee - Pain    Valkyrie returns today for MRI review of the left knee.  Her pain is slightly better but still significant   Review of Systems   Objective: Vital Signs: There were no vitals taken for this visit.  Physical Exam  Ortho Exam Left knee exam is unchanged. Specialty Comments:  No specialty comments available.  Imaging: No results found.   PMFS History: Patient Active Problem List   Diagnosis Date Noted  . Pain of knee joint with osteochondral injury 10/23/2019  . Primary osteoarthritis of left knee 05/13/2019  . Primary osteoarthritis of right knee 05/13/2019  . HLD (hyperlipidemia) 11/17/2018  . Hyperglycemia 11/17/2018  .  Arthritis of knee, degenerative 11/17/2018  . Routine general medical examination at a health care facility 05/17/2018  . Essential hypertension 01/15/2018  . Edema 01/15/2018   Past Medical History:  Diagnosis Date  . Anemia    past hx of anemia  . Arthritis   . Chicken pox   . HLD (hyperlipidemia) 11/17/2018  . Hypertension     Family History  Problem Relation Age of Onset  . Arthritis Mother   . Heart attack Mother   . Stroke Mother   . Hypertension Father   . Colon cancer Neg Hx   . Colon polyps Neg Hx   . Esophageal cancer Neg Hx   . Rectal cancer Neg Hx   . Stomach cancer Neg Hx   . Breast cancer Neg Hx     Past Surgical History:  Procedure Laterality Date  . CARPAL TUNNEL RELEASE Left 06/22/2017   Procedure: LEFT CARPAL TUNNEL RELEASE;  Surgeon: Betha Loa, MD;  [facility];  Service: Orthopedics;  Laterality: Left;  . COLONOSCOPY     10 + yrs ago was normal per pt   . TONSILLECTOMY  1962  . TUBAL LIGATION  1982   Social History   Occupational History  . Not on file  Tobacco Use  . Smoking status: Never Smoker  . Smokeless tobacco: Never Used  Vaping Use  . Vaping Use: Never used  Substance and Sexual Activity  . Alcohol use: No  . Drug use: No  . Sexual activity: Not on file

## 2019-10-26 ENCOUNTER — Telehealth: Payer: Self-pay | Admitting: Orthopaedic Surgery

## 2019-10-26 NOTE — Telephone Encounter (Signed)
Patient called asked for a call back to schedule surgery. The number to contact patient is [phone]

## 2019-10-27 NOTE — Telephone Encounter (Signed)
I called patient and left voice mail for return call. °

## 2019-10-28 NOTE — Telephone Encounter (Signed)
Spoke with patient and scheduled.

## 2019-11-01 ENCOUNTER — Other Ambulatory Visit: Payer: Self-pay | Admitting: Physician Assistant

## 2019-11-01 MED ORDER — HYDROCODONE-ACETAMINOPHEN 5-325 MG PO TABS: 1.0000 | ORAL_TABLET | Freq: Three times a day (TID) | ORAL | 0 refills | Status: DC | PRN

## 2019-11-01 MED ORDER — ONDANSETRON HCL 4 MG PO TABS: 4.0000 mg | ORAL_TABLET | Freq: Three times a day (TID) | ORAL | 0 refills | Status: DC | PRN

## 2019-11-03 ENCOUNTER — Encounter: Payer: Self-pay | Admitting: Orthopaedic Surgery

## 2019-11-03 DIAGNOSIS — M659 Synovitis and tenosynovitis, unspecified: Secondary | ICD-10-CM | POA: Diagnosis not present

## 2019-11-03 DIAGNOSIS — M93262 Osteochondritis dissecans, left knee: Secondary | ICD-10-CM | POA: Diagnosis not present

## 2019-11-03 DIAGNOSIS — M25562 Pain in left knee: Secondary | ICD-10-CM | POA: Diagnosis not present

## 2019-11-10 ENCOUNTER — Encounter: Payer: Self-pay | Admitting: Orthopaedic Surgery

## 2019-11-10 ENCOUNTER — Other Ambulatory Visit: Payer: Self-pay

## 2019-11-10 ENCOUNTER — Ambulatory Visit (INDEPENDENT_AMBULATORY_CARE_PROVIDER_SITE_OTHER): Payer: Medicare Other | Admitting: Physician Assistant

## 2019-11-10 VITALS — Ht 65.5 in | Wt 162.0 lb

## 2019-11-10 DIAGNOSIS — M1712 Unilateral primary osteoarthritis, left knee: Secondary | ICD-10-CM

## 2019-11-10 DIAGNOSIS — Z9889 Other specified postprocedural states: Secondary | ICD-10-CM

## 2019-11-10 NOTE — Progress Notes (Addendum)
Post-Op Visit Note   Patient: Christine Pitts           Date of Birth: [birth year]           MRN: [id number] Visit Date: 11/10/2019 PCP: Corwin Levins, MD   Assessment & Plan:  Chief Complaint:  Chief Complaint  Patient presents with  . Left Knee - Routine Post Op    Left knee scope 11/03/2019   Visit Diagnoses:  1. S/P left knee arthroscopy   2. Unilateral primary osteoarthritis, left knee     Plan: Patient is a pleasant 69 year old female who comes in today 1 week out left knee arthroscopic debridement and abrasion arthroplasty.  she had previously medial femoral condyle he has been doing very well.  She has been ambulating with a walker on occasion.  She has also walks without assistance and without any issues.  No fevers or chills.  Minimal pain.  Examination of her left knee reveals well-healing surgical portals without complication.  Calves are soft nontender.  She is neurovascular intact distally.  At this point, stitches were removed and Steri-Strips applied.  I provided her with a home exercise program.  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  We we will call [employer] for her to be fitted for a medial unloader knee brace.  She will follow up with [place] in 5 weeks time for recheck.  Call with concerns or questions.  Follow-Up Instructions: Return in about 5 weeks (around 12/15/2019).   Orders:  No orders of the defined types were placed in this encounter.  No orders of the defined types were placed in this encounter.   Imaging: No new imaging  PMFS History: Patient Active Problem List   Diagnosis Date Noted  . Osteochondritis dissecans of left knee 10/23/2019  . Primary osteoarthritis of left knee 05/13/2019  . Primary osteoarthritis of right knee 05/13/2019  . HLD (hyperlipidemia) 11/17/2018  . Hyperglycemia 11/17/2018  . Arthritis of knee, degenerative 11/17/2018  . Routine general medical examination at a health care facility 05/17/2018    . Essential hypertension 01/15/2018  . Edema 01/15/2018   Past Medical History:  Diagnosis Date  . Anemia    past hx of anemia  . Arthritis   . Chicken pox   . HLD (hyperlipidemia) 11/17/2018  . Hypertension     Family History  Problem Relation Age of Onset  . Arthritis Mother   . Heart attack Mother   . Stroke Mother   . Hypertension Father   . Colon cancer Neg Hx   . Colon polyps Neg Hx   . Esophageal cancer Neg Hx   . Rectal cancer Neg Hx   . Stomach cancer Neg Hx   . Breast cancer Neg Hx     Past Surgical History:  Procedure Laterality Date  . CARPAL TUNNEL RELEASE Left 06/22/2017   Procedure: LEFT CARPAL TUNNEL RELEASE;  Surgeon: Betha Loa, MD;  Location: [facility];  Service: Orthopedics;  Laterality: Left;  . COLONOSCOPY     10 + yrs ago was normal per pt   . TONSILLECTOMY  1962  . TUBAL LIGATION  1982   Social History   Occupational History  . Not on file  Tobacco Use  . Smoking status: Never Smoker  . Smokeless tobacco: Never Used  Vaping Use  . Vaping Use: Never used  Substance and Sexual Activity  . Alcohol use: No  . Drug  use: No  . Sexual activity: Not on file

## 2019-11-18 ENCOUNTER — Other Ambulatory Visit: Payer: Self-pay | Admitting: Internal Medicine

## 2019-11-18 DIAGNOSIS — Z1231 Encounter for screening mammogram for malignant neoplasm of breast: Secondary | ICD-10-CM

## 2019-11-22 ENCOUNTER — Ambulatory Visit: Payer: Medicare Other | Admitting: Internal Medicine

## 2019-11-25 ENCOUNTER — Telehealth: Payer: Self-pay | Admitting: Orthopaedic Surgery

## 2019-11-25 NOTE — Telephone Encounter (Signed)
What does she do for work?

## 2019-11-25 NOTE — Telephone Encounter (Signed)
Please advise 

## 2019-11-25 NOTE — Telephone Encounter (Signed)
Patient aware the note is at the front desk

## 2019-11-25 NOTE — Telephone Encounter (Signed)
Sure thing!

## 2019-11-25 NOTE — Telephone Encounter (Signed)
Patient called advised she need a note stating when she can return to work and what restrictions she will have. Patient said she will return to work 12/06/2019. Patient asked if she can get two copies of the note. The number to contact patient is [phone]

## 2019-11-25 NOTE — Telephone Encounter (Signed)
Current work note (OOW) note ends on 12/01/2019 (Thursday) next F/U is scheduled 12/13/2019. Okay to give her an extension OOW until next Office visit?  FYI she will need another work note next visit on 12/13/2019.  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

## 2019-11-29 ENCOUNTER — Other Ambulatory Visit: Payer: Self-pay

## 2019-11-29 ENCOUNTER — Encounter: Payer: Self-pay | Admitting: Internal Medicine

## 2019-11-29 ENCOUNTER — Ambulatory Visit (INDEPENDENT_AMBULATORY_CARE_PROVIDER_SITE_OTHER): Payer: Medicare Other | Admitting: Internal Medicine

## 2019-11-29 VITALS — BP 102/70 | HR 73 | Temp 97.9°F | Ht 65.5 in | Wt 162.0 lb

## 2019-11-29 DIAGNOSIS — E785 Hyperlipidemia, unspecified: Secondary | ICD-10-CM | POA: Diagnosis not present

## 2019-11-29 DIAGNOSIS — E559 Vitamin D deficiency, unspecified: Secondary | ICD-10-CM

## 2019-11-29 DIAGNOSIS — I10 Essential (primary) hypertension: Secondary | ICD-10-CM

## 2019-11-29 DIAGNOSIS — R739 Hyperglycemia, unspecified: Secondary | ICD-10-CM

## 2019-11-29 MED ORDER — ATORVASTATIN CALCIUM 20 MG PO TABS: 20.0000 mg | ORAL_TABLET | Freq: Every day | ORAL | 3 refills | Status: DC

## 2019-11-29 NOTE — Progress Notes (Signed)
Subjective:    Patient ID: Christine Pitts, female    DOB: Mar 01, 1951, 69 y.o.   MRN: [id number]  HPI  Here to f/u; overall doing ok,  Pt denies chest pain, increasing sob or doe, wheezing, orthopnea, PND, increased LE swelling, palpitations, dizziness or syncope.  Pt denies new neurological symptoms such as new headache, or facial or extremity weakness or numbness.  Pt denies polydipsia, polyuria, or low sugar episode.  Pt states overall good compliance with meds, mostly trying to follow appropriate diet, with wt overall stable,  but little exercise however. Wt Readings from Last 3 Encounters:  11/29/19 162 lb (73.5 kg)  11/10/19 162 lb (73.5 kg)  05/25/19 162 lb (73.5 kg)  Trying to hold on left knee surgury per Dr Roda Shutters as they think not needed yet, walking with walker now to keep wt off the leg, but can walk without, has a brace coming to start in a couple of days.  No falls.  Tolerating Vit D well. Past Medical History:  Diagnosis Date  . Anemia    past hx of anemia  . Arthritis   . Chicken pox   . HLD (hyperlipidemia) 11/17/2018  . Hypertension    Past Surgical History:  Procedure Laterality Date  . CARPAL TUNNEL RELEASE Left 06/22/2017   Procedure: LEFT CARPAL TUNNEL RELEASE;  Surgeon: Betha Loa, MD;  Location: [facility];  Service: Orthopedics;  Laterality: Left;  . COLONOSCOPY     10 + yrs ago was normal per pt   . TONSILLECTOMY  1962  . TUBAL LIGATION  1982    reports that she has never smoked. She has never used smokeless tobacco. She reports that she does not drink alcohol and does not use drugs. family history includes Arthritis in her mother; Heart attack in her mother; Hypertension in her father; Stroke in her mother. No Known Allergies Current Outpatient Medications on File Prior to Visit  Medication Sig Dispense Refill  . aspirin 81 MG EC tablet aspirin 81 mg tablet,delayed release  TK 1 T PO  D    . betamethasone dipropionate (DIPROLENE) 0.05 %  ointment SMARTSIG:Sparingly Topical Every Night    . bisacodyl (DULCOLAX) 5 MG EC tablet Take 5 mg by mouth once. For golytely prep x 4 for colon 9-25    . celecoxib (CELEBREX) 200 MG capsule Take 1 capsule (200 mg total) by mouth 2 (two) times daily. 30 capsule 3  . chlorhexidine (PERIDEX) 0.12 % solution chlorhexidine gluconate 0.12 % mouthwash    . furosemide (LASIX) 20 MG tablet TAKE 1 TABLET(20 MG) BY MOUTH DAILY 90 tablet 1  . HYDROcodone-acetaminophen (NORCO) 5-325 MG tablet Take 1-2 tablets by mouth 3 (three) times daily as needed. 30 tablet 0  . Influenza Vac High-Dose Quad (FLUZONE HIGH-DOSE QUADRIVALENT) 0.7 ML SUSY Fluzone High-Dose Quad 2020-21 (PF) 240 mcg/0.7 mL IM syringe  UTD    . Naltrexone-buPROPion HCl ER (CONTRAVE) 8-90 MG TB12 Contrave 8 mg-90 mg tablet,extended release    . ondansetron (ZOFRAN) 4 MG tablet Take 1 tablet (4 mg total) by mouth every 8 (eight) hours as needed for nausea or vomiting. 40 tablet 0  . phentermine (ADIPEX-P) 37.5 MG tablet phentermine 37.5 mg tablet  TK 1 T PO QD IN THE MORNING    . polyethylene glycol-electrolytes (NULYTELY) 420 g solution peg-electrolyte solution 420 gram oral solution    . predniSONE (STERAPRED UNI-PAK 21 TAB) 10 MG (21) TBPK tablet Take as directed 21 tablet 0  .  traMADol (ULTRAM) 50 MG tablet TAKE 1 TABLET(50 MG) BY MOUTH EVERY 6 HOURS AS NEEDED 60 tablet 1  . valsartan (DIOVAN) 80 MG tablet TAKE 1 TABLET(80 MG) BY MOUTH DAILY 90 tablet 1   No current facility-administered medications on file prior to visit.   Review of Systems All otherwise neg per pt    Objective:   Physical Exam BP 102/70 (BP Location: Left Arm, Patient Position: Sitting, Cuff Size: Large)   Pulse 73   Temp 97.9 F (36.6 C) (Oral)   Ht 5' 5.5" (1.664 m)   Wt 162 lb (73.5 kg)   SpO2 97%   BMI 26.55 kg/m  VS noted,  Constitutional: Pt appears in NAD HENT: Head: NCAT.  Right Ear: External ear normal.  Left Ear: External ear normal.  Eyes: .  Pupils are equal, round, and reactive to light. Conjunctivae and EOM are normal Nose: without d/c or deformity Neck: Neck supple. Gross normal ROM Cardiovascular: Normal rate and regular rhythm.   Pulmonary/Chest: Effort normal and breath sounds without rales or wheezing.  Abd:  Soft, NT, ND, + BS, no organomegaly Neurological: Pt is alert. At baseline orientation, motor grossly intact Skin: Skin is warm. No rashes, other new lesions, no LE edema Psychiatric: Pt behavior is normal without agitation  All otherwise neg per pt Lab Results  Component Value Date   WBC 5.9 05/24/2019   HGB 12.5 05/24/2019   HCT 38.1 05/24/2019   PLT 357.0 05/24/2019   GLUCOSE 91 05/24/2019   CHOL 216 (H) 05/24/2019   TRIG 50.0 05/24/2019   HDL 92.00 05/24/2019   LDLCALC 114 (H) 05/24/2019   ALT 13 05/24/2019   AST 16 05/24/2019   NA 139 05/24/2019   K 3.8 05/24/2019   CL 105 05/24/2019   CREATININE 0.74 05/24/2019   BUN 25 (H) 05/24/2019   CO2 25 05/24/2019   TSH 1.78 05/24/2019   HGBA1C 5.8 05/24/2019      Assessment & Plan:

## 2019-11-29 NOTE — Patient Instructions (Signed)
Ok to increase the generic lipitor for cholesterol to 20 mg per day  Ok to continue the Vit d at 2000 units per day  Please continue all other medications as before, and refills have been done if requested.  Please have the pharmacy call with any other refills you may need.  Please continue your efforts at being more active, low cholesterol diet, and weight control.  Please keep your appointments with your specialists as you may have planned  Please make an Appointment to return in 6 months, or sooner if needed

## 2019-12-04 ENCOUNTER — Encounter: Payer: Self-pay | Admitting: Internal Medicine

## 2019-12-04 DIAGNOSIS — E559 Vitamin D deficiency, unspecified: Secondary | ICD-10-CM | POA: Insufficient documentation

## 2019-12-04 NOTE — Assessment & Plan Note (Signed)
stable overall by history and exam, recent data reviewed with pt, and pt to continue medical treatment as before except for increased lipitor 20 qd,  to f/u any worsening symptoms or concerns,

## 2019-12-04 NOTE — Assessment & Plan Note (Addendum)
stable overall by history and exam, recent data reviewed with pt, and pt to continue medical treatment as before,  to f/u any worsening symptoms or concerns  I spent 31 minutes in preparing to see the patient by review of recent labs, imaging and procedures, obtaining and reviewing separately obtained history, communicating with the patient and family or caregiver, ordering medications, tests or procedures, and documenting clinical information in the EHR including the differential Dx, treatment, and any further evaluation and other management of hyperglycemia, hld, htn, vit d def 

## 2019-12-04 NOTE — Assessment & Plan Note (Signed)
Cont oral replacement 

## 2019-12-04 NOTE — Assessment & Plan Note (Signed)
stable overall by history and exam, recent data reviewed with pt, and pt to continue medical treatment as before,  to f/u any worsening symptoms or concerns  

## 2019-12-06 ENCOUNTER — Other Ambulatory Visit: Payer: Self-pay | Admitting: Internal Medicine

## 2019-12-06 DIAGNOSIS — I10 Essential (primary) hypertension: Secondary | ICD-10-CM

## 2019-12-06 DIAGNOSIS — E785 Hyperlipidemia, unspecified: Secondary | ICD-10-CM

## 2019-12-06 NOTE — Telephone Encounter (Signed)
Only the valsartan - Please refill as per office routine med refill policy (all routine meds refilled for 3 mo or monthly per pt preference up to one year from last visit, then month to month grace period for 3 mo, then further med refills will have to be denied)

## 2019-12-07 ENCOUNTER — Other Ambulatory Visit: Payer: Self-pay | Admitting: Internal Medicine

## 2019-12-07 DIAGNOSIS — I10 Essential (primary) hypertension: Secondary | ICD-10-CM

## 2019-12-07 DIAGNOSIS — R609 Edema, unspecified: Secondary | ICD-10-CM

## 2019-12-07 NOTE — Telephone Encounter (Signed)
Please refill as per office routine med refill policy (all routine meds refilled for 3 mo or monthly per pt preference up to one year from last visit, then month to month grace period for 3 mo, then further med refills will have to be denied)  

## 2019-12-13 ENCOUNTER — Encounter: Payer: Self-pay | Admitting: Orthopaedic Surgery

## 2019-12-13 ENCOUNTER — Ambulatory Visit (INDEPENDENT_AMBULATORY_CARE_PROVIDER_SITE_OTHER): Payer: Medicare Other | Admitting: Orthopaedic Surgery

## 2019-12-13 ENCOUNTER — Telehealth: Payer: Self-pay

## 2019-12-13 DIAGNOSIS — Z9889 Other specified postprocedural states: Secondary | ICD-10-CM

## 2019-12-13 NOTE — Telephone Encounter (Signed)
Patient aware.

## 2019-12-13 NOTE — Telephone Encounter (Signed)
FYI-You saw patient this AM. Please advise.

## 2019-12-13 NOTE — Telephone Encounter (Signed)
what can and can't she do.

## 2019-12-13 NOTE — Progress Notes (Signed)
Patient ID: [name], female   DOB: 03/23/1951, 69 y.o.   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  She has since received her medial unloader brace.  She has no complaints and wishes to return back to work.  Left knee shows fully healed surgical scars.  Excellent range of motion.  No swelling or joint effusion.  Well fitting unloader brace.  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  She will continue to do her home exercises for strengthening.  Questions encouraged and answered.  Follow-up as needed.

## 2019-12-13 NOTE — Telephone Encounter (Signed)
Patient came in and saw Dr.Xu she wants to know if she can go back to the gym and what she can and cant do. Call back :[phone]

## 2019-12-13 NOTE — Telephone Encounter (Signed)
No restrictions

## 2019-12-13 NOTE — Telephone Encounter (Signed)
yes

## 2020-01-02 ENCOUNTER — Other Ambulatory Visit: Payer: Self-pay

## 2020-01-02 ENCOUNTER — Ambulatory Visit
Admission: RE | Admit: 2020-01-02 | Discharge: 2020-01-02 | Disposition: A | Discharge status: Home / Self Care | Payer: Medicare Other | Source: Ambulatory Visit | Attending: Internal Medicine | Admitting: Internal Medicine

## 2020-01-02 DIAGNOSIS — Z1231 Encounter for screening mammogram for malignant neoplasm of breast: Secondary | ICD-10-CM

## 2020-05-28 ENCOUNTER — Ambulatory Visit: Payer: Medicare Other | Admitting: Internal Medicine

## 2020-05-31 ENCOUNTER — Other Ambulatory Visit: Payer: Self-pay

## 2020-05-31 ENCOUNTER — Ambulatory Visit (INDEPENDENT_AMBULATORY_CARE_PROVIDER_SITE_OTHER): Payer: Medicare Other | Admitting: Internal Medicine

## 2020-05-31 ENCOUNTER — Encounter: Payer: Self-pay | Admitting: Internal Medicine

## 2020-05-31 VITALS — BP 138/78 | HR 78 | Ht 65.5 in | Wt 155.0 lb

## 2020-05-31 DIAGNOSIS — M8580 Other specified disorders of bone density and structure, unspecified site: Secondary | ICD-10-CM

## 2020-05-31 DIAGNOSIS — Z0001 Encounter for general adult medical examination with abnormal findings: Secondary | ICD-10-CM

## 2020-05-31 DIAGNOSIS — E7800 Pure hypercholesterolemia, unspecified: Secondary | ICD-10-CM

## 2020-05-31 DIAGNOSIS — M1712 Unilateral primary osteoarthritis, left knee: Secondary | ICD-10-CM | POA: Diagnosis not present

## 2020-05-31 DIAGNOSIS — I10 Essential (primary) hypertension: Secondary | ICD-10-CM

## 2020-05-31 DIAGNOSIS — Z0000 Encounter for general adult medical examination without abnormal findings: Secondary | ICD-10-CM | POA: Diagnosis not present

## 2020-05-31 DIAGNOSIS — E559 Vitamin D deficiency, unspecified: Secondary | ICD-10-CM | POA: Diagnosis not present

## 2020-05-31 DIAGNOSIS — Z23 Encounter for immunization: Secondary | ICD-10-CM | POA: Diagnosis not present

## 2020-05-31 DIAGNOSIS — R739 Hyperglycemia, unspecified: Secondary | ICD-10-CM | POA: Diagnosis not present

## 2020-05-31 LAB — URINALYSIS, ROUTINE W REFLEX MICROSCOPIC
Bilirubin Urine: NEGATIVE
Hgb urine dipstick: NEGATIVE
Ketones, ur: NEGATIVE
Leukocytes,Ua: NEGATIVE
Nitrite: NEGATIVE
RBC / HPF: NONE SEEN (ref 0–?)
Specific Gravity, Urine: >=1.03 — AB (ref 1.000–1.030)
Urine Glucose: NEGATIVE
Urobilinogen, UA: 0.2 (ref 0.0–1.0)
pH: 5.5 (ref 5.0–8.0)

## 2020-05-31 LAB — HEPATIC FUNCTION PANEL
ALT: 12 U/L (ref 0–35)
AST: 16 U/L (ref 0–37)
Albumin: 4.2 g/dL (ref 3.5–5.2)
Alkaline Phosphatase: 76 U/L (ref 39–117)
Bilirubin, Direct: 0.1 mg/dL (ref 0.0–0.3)
Total Bilirubin: 0.4 mg/dL (ref 0.2–1.2)
Total Protein: 7 g/dL (ref 6.0–8.3)

## 2020-05-31 LAB — BASIC METABOLIC PANEL
BUN: 16 mg/dL (ref 6–23)
CO2: 27 mEq/L (ref 19–32)
Calcium: 9.2 mg/dL (ref 8.4–10.5)
Chloride: 108 mEq/L (ref 96–112)
Creatinine, Ser: 0.85 mg/dL (ref 0.40–1.20)
GFR: 69.91 mL/min (ref >60.00)
Glucose, Bld: 88 mg/dL (ref 70–99)
Potassium: 3.9 mEq/L (ref 3.5–5.1)
Sodium: 142 mEq/L (ref 135–145)

## 2020-05-31 LAB — LIPID PANEL
Cholesterol: 173 mg/dL (ref 0–200)
HDL: 82.9 mg/dL (ref >39.00)
LDL Cholesterol: 79 mg/dL (ref 0–99)
NonHDL: 90.5
Total CHOL/HDL Ratio: 2
Triglycerides: 56 mg/dL (ref 0.0–149.0)
VLDL: 11.2 mg/dL (ref 0.0–40.0)

## 2020-05-31 LAB — CBC WITH DIFFERENTIAL/PLATELET
Basophils Absolute: 0 10*3/uL (ref 0.0–0.1)
Basophils Relative: 0.9 % (ref 0.0–3.0)
Eosinophils Absolute: 0.1 10*3/uL (ref 0.0–0.7)
Eosinophils Relative: 3.8 % (ref 0.0–5.0)
HCT: 36.2 % (ref 36.0–46.0)
Hemoglobin: 12.1 g/dL (ref 12.0–15.0)
Lymphocytes Relative: 27.5 % (ref 12.0–46.0)
Lymphs Abs: 1 10*3/uL (ref 0.7–4.0)
MCHC: 33.6 g/dL (ref 30.0–36.0)
MCV: 100.1 fl — ABNORMAL HIGH (ref 78.0–100.0)
Monocytes Absolute: 0.2 10*3/uL (ref 0.1–1.0)
Monocytes Relative: 6.1 % (ref 3.0–12.0)
Neutro Abs: 2.2 10*3/uL (ref 1.4–7.7)
Neutrophils Relative %: 61.7 % (ref 43.0–77.0)
Platelets: 294 10*3/uL (ref 150.0–400.0)
RBC: 3.61 Mil/uL — ABNORMAL LOW (ref 3.87–5.11)
RDW: 13.5 % (ref 11.5–15.5)
WBC: 3.5 10*3/uL — ABNORMAL LOW (ref 4.0–10.5)

## 2020-05-31 LAB — VITAMIN D 25 HYDROXY (VIT D DEFICIENCY, FRACTURES): VITD: 32.06 ng/mL (ref 30.00–100.00)

## 2020-05-31 LAB — HEMOGLOBIN A1C: Hgb A1c MFr Bld: 5.5 % (ref 4.6–6.5)

## 2020-05-31 LAB — TSH: TSH: 1.09 u[IU]/mL (ref 0.35–4.50)

## 2020-05-31 MED ORDER — ASPIRIN 81 MG PO TBEC: DELAYED_RELEASE_TABLET | ORAL | 99 refills | Status: AC

## 2020-05-31 MED ORDER — THERA-D 2000 50 MCG (2000 UT) PO TABS: ORAL_TABLET | ORAL | 99 refills | Status: AC

## 2020-05-31 MED ORDER — ATORVASTATIN CALCIUM 40 MG PO TABS: 40.0000 mg | ORAL_TABLET | Freq: Every day | ORAL | 3 refills | Status: DC

## 2020-05-31 MED ORDER — VALSARTAN 80 MG PO TABS: ORAL_TABLET | ORAL | 3 refills | Status: DC

## 2020-05-31 NOTE — Assessment & Plan Note (Signed)
S/p arthroscopy with worsening stiffness after working too hard on inclines on treadmill - pt advised to avoid inclines but focus on faster and longer and on treadmill, as well as ellipitical better for the knees

## 2020-05-31 NOTE — Progress Notes (Signed)
Patient ID: Christine Pitts, female   DOB: June 09, 1950, 70 y.o.   MRN: [id number]         Chief Complaint:: wellness exam and f/u htn, low vit d, hyperglycemia, hld. djd       HPI:  Christine Pitts is a 70 y.o. female here for wellness exam; due for pneumovax o/w up to date with immunization and preventive referrals.                         Als not taking the asa EC recenty but wiling to restart   Did have left knee arthroscopy wih improved pain but still mild, intermittent, dull, worse to walk, better to rest, nothing else makes better or worse.  Trying to follow low chol diet. Pt denies chest pain, increased sob or doe, wheezing, orthopnea, PND, increased LE swelling, palpitations, dizziness or syncope.  Not taking vit d.   Pt denies polydipsia, polyuria,  Pt denies fever, wt loss, night sweats, loss of appetite, or other constitutional symptoms  No other new complaints  Wt Readings from Last 3 Encounters:  05/31/20 155 lb (70.3 kg)  11/29/19 162 lb (73.5 kg)  11/10/19 162 lb (73.5 kg)   BP Readings from Last 3 Encounters:  05/31/20 138/78  11/29/19 102/70  05/24/19 134/72   Immunization History  Administered Date(s) Administered  . Influenza, High Dose Seasonal PF 12/30/2017, 11/15/2018  . Influenza-Unspecified 11/15/2018, 01/30/2020  . PFIZER(Purple Top)SARS-COV-2 Vaccination 05/24/2019, 06/14/2019, 01/16/2020  . Pneumococcal Conjugate-13 05/17/2018  . Pneumococcal Polysaccharide-23 05/31/2020  . Tdap 02/10/2018   There are no preventive care reminders to display for this patient.    Past Medical History:  Diagnosis Date  . Anemia    past hx of anemia  . Arthritis   . Chicken pox   . HLD (hyperlipidemia) 11/17/2018  . Hypertension    Past Surgical History:  Procedure Laterality Date  . CARPAL TUNNEL RELEASE Left 06/22/2017   Procedure: LEFT CARPAL TUNNEL RELEASE;  Surgeon: Betha Loa, MD;  Location: [facility];  Service: Orthopedics;  Laterality: Left;  .  COLONOSCOPY     10 + yrs ago was normal per pt   . TONSILLECTOMY  1962  . TUBAL LIGATION  1982    reports that she has never smoked. She has never used smokeless tobacco. She reports that she does not drink alcohol and does not use drugs. family history includes Arthritis in her mother; Heart attack in her mother; Hypertension in her father; Stroke in her mother. No Known Allergies Current Outpatient Medications on File Prior to Visit  Medication Sig Dispense Refill  . bisacodyl (DULCOLAX) 5 MG EC tablet Take 5 mg by mouth once. For golytely prep x 4 for colon 9-25    . celecoxib (CELEBREX) 200 MG capsule Take 1 capsule (200 mg total) by mouth 2 (two) times daily. 30 capsule 3   No current facility-administered medications on file prior to visit.        ROS:  All others reviewed and negative.  Objective        PE:  BP 138/78   Pulse 78   Ht 5' 5.5" (1.664 m)   Wt 155 lb (70.3 kg)   SpO2 99%   BMI 25.40 kg/m                 Constitutional: Pt appears in NAD  HENT: Head: NCAT.                Right Ear: External ear normal.                 Left Ear: External ear normal.                Eyes: . Pupils are equal, round, and reactive to light. Conjunctivae and EOM are normal               Nose: without d/c or deformity               Neck: Neck supple. Gross normal ROM               Cardiovascular: Normal rate and regular rhythm.                 Pulmonary/Chest: Effort normal and breath sounds without rales or wheezing.                Abd:  Soft, NT, ND, + BS, no organomegaly               Neurological: Pt is alert. At baseline orientation, motor grossly intact               Skin: Skin is warm. No rashes, no other new lesions, LE edema - none               Psychiatric: Pt behavior is normal without agitation   Micro: none  Cardiac tracings I have personally interpreted today:  none  Pertinent Radiological findings (summarize): none   Lab Results  Component Value  Date   WBC 3.5 (L) 05/31/2020   HGB 12.1 05/31/2020   HCT 36.2 05/31/2020   PLT 294.0 05/31/2020   GLUCOSE 88 05/31/2020   CHOL 173 05/31/2020   TRIG 56.0 05/31/2020   HDL 82.90 05/31/2020   LDLCALC 79 05/31/2020   ALT 12 05/31/2020   AST 16 05/31/2020   NA 142 05/31/2020   K 3.9 05/31/2020   CL 108 05/31/2020   CREATININE 0.85 05/31/2020   BUN 16 05/31/2020   CO2 27 05/31/2020   TSH 1.09 05/31/2020   HGBA1C 5.5 05/31/2020   Assessment/Plan:  Christine Pitts is a 70 y.o. Black or African American [2] female with  has a past medical history of Anemia, Arthritis, Chicken pox, HLD (hyperlipidemia) (11/17/2018), and Hypertension.  Primary osteoarthritis of left knee S/p arthroscopy with worsening stiffness after working too hard on inclines on treadmill - pt advised to avoid inclines but focus on faster and longer and on treadmill, as well as ellipitical better for the knees  Encounter for well adult exam with abnormal findings Age and sex appropriate education and counseling updated with regular exercise and diet Referrals for preventative services - none needed Immunizations addressed - for pneumovax Smoking counseling  - none needed Evidence for depression or other mood disorder - none significant Most recent labs reviewed. I have personally reviewed and have noted: 1) the patient's medical and social history 2) The patient's current medications and supplements 3) The patient's height, weight, and BMI have been recorded in the chart   Hyperglycemia Lab Results  Component Value Date   HGBA1C 5.5 05/31/2020   Stable, pt to continue current medical treatment  - diet and wt control   Hyperlipidemia Lab Results  Component Value Date   LDLCALC 79 05/31/2020   Mild uncontrolled with goal < 70, pt to increase current  statin to lipitor 40   Hypertensive disorder BP Readings from Last 3 Encounters:  05/31/20 138/78  11/29/19 102/70  05/24/19 134/72   Stable, pt to  continue medical treatment diovan 80   Current Outpatient Medications (Cardiovascular):  .  atorvastatin (LIPITOR) 40 MG tablet, Take 1 tablet (40 mg total) by mouth daily. .  furosemide (LASIX) 20 MG tablet, TAKE 1 TABLET(20 MG) BY MOUTH DAILY .  valsartan (DIOVAN) 80 MG tablet, TAKE 1 TABLET(80 MG) BY MOUTH DAILY   Current Outpatient Medications (Analgesics):  .  celecoxib (CELEBREX) 200 MG capsule, Take 1 capsule (200 mg total) by mouth 2 (two) times daily. Marland Kitchen  aspirin 81 MG EC tablet, 1 tab by mouth once daily   Current Outpatient Medications (Other):  .  bisacodyl (DULCOLAX) 5 MG EC tablet, Take 5 mg by mouth once. For golytely prep x 4 for colon 9-25 .  Cholecalciferol (THERA-D 2000) 50 MCG (2000 UT) TABS, 1 tab by mouth once daily   Osteopenia For f/u pth lab  Vitamin D deficiency Last vitamin D Lab Results  Component Value Date   VD25OH 32.06 05/31/2020   Low normal, advised to start vit d3 2000 u daily oral replacement  Followup: Return in about 1 year (around 05/31/2021).  Oliver Barre, MD 06/06/2020 8:07 AM [facility]

## 2020-05-31 NOTE — Patient Instructions (Addendum)
You had the pneumovax pneumonia shots today  Ok to take the ASA 81 mg per day (enteric coated)  Please take at least OTC Vitamin D3 at 2000 units per day, indefinitely.  Ok to incresae the lipitor to 40 mg per day for cholesterol  Please continue all other medications as before, and refills have been done if requested.  Please have the pharmacy call with any other refills you may need.  Please continue your efforts at being more active, low cholesterol diet, and weight control.  You are otherwise up to date with prevention measures today.  Please keep your appointments with your specialists as you may have planned  Please go to the LAB at the blood drawing area for the tests to be done  You will be contacted by phone if any changes need to be made immediately.  Otherwise, you will receive a letter about your results with an explanation, but please check with MyChart first.  Please remember to sign up for MyChart if you have not done so, as this will be important to you in the future with finding out test results, communicating by private email, and scheduling acute appointments online when needed.  Please make an Appointment to return for your 1 year visit, or sooner if needed, with Lab testing by Appointment as well, to be done about 3-5 days before at the FIRST FLOOR Lab (so this is for TWO appointments - please see the scheduling desk as you leave)  Due to the ongoing Covid 19 pandemic, our lab now requires an appointment for any labs done at our office.  If you need labs done and do not have an appointment, please call our office ahead of time to schedule before presenting to the lab for your testing.

## 2020-06-04 LAB — PTH, INTACT AND CALCIUM
Calcium: 9.2 mg/dL (ref 8.6–10.4)
PTH: 52 pg/mL (ref 14–64)

## 2020-06-05 ENCOUNTER — Other Ambulatory Visit: Payer: Self-pay | Admitting: Internal Medicine

## 2020-06-05 DIAGNOSIS — R609 Edema, unspecified: Secondary | ICD-10-CM

## 2020-06-05 DIAGNOSIS — I10 Essential (primary) hypertension: Secondary | ICD-10-CM

## 2020-06-06 ENCOUNTER — Encounter: Payer: Self-pay | Admitting: Internal Medicine

## 2020-06-06 NOTE — Assessment & Plan Note (Addendum)
Age and sex appropriate education and counseling updated with regular exercise and diet Referrals for preventative services - none needed Immunizations addressed - for pneumovax today Smoking counseling  - none needed Evidence for depression or other mood disorder - none significant Most recent labs reviewed. I have personally reviewed and have noted: 1) the patient's medical and social history 2) The patient's current medications and supplements 3) The patient's height, weight, and BMI have been recorded in the chart  

## 2020-06-06 NOTE — Assessment & Plan Note (Signed)
BP Readings from Last 3 Encounters:  05/31/20 138/78  11/29/19 102/70  05/24/19 134/72   Stable, pt to continue medical treatment diovan 80   Current Outpatient Medications (Cardiovascular):  .  atorvastatin (LIPITOR) 40 MG tablet, Take 1 tablet (40 mg total) by mouth daily. .  furosemide (LASIX) 20 MG tablet, TAKE 1 TABLET(20 MG) BY MOUTH DAILY .  valsartan (DIOVAN) 80 MG tablet, TAKE 1 TABLET(80 MG) BY MOUTH DAILY   Current Outpatient Medications (Analgesics):  .  celecoxib (CELEBREX) 200 MG capsule, Take 1 capsule (200 mg total) by mouth 2 (two) times daily. Marland Kitchen  aspirin 81 MG EC tablet, 1 tab by mouth once daily   Current Outpatient Medications (Other):  .  bisacodyl (DULCOLAX) 5 MG EC tablet, Take 5 mg by mouth once. For golytely prep x 4 for colon 9-25 .  Cholecalciferol (THERA-D 2000) 50 MCG (2000 UT) TABS, 1 tab by mouth once daily

## 2020-06-06 NOTE — Assessment & Plan Note (Signed)
For f/u pth lab

## 2020-06-06 NOTE — Assessment & Plan Note (Signed)
Lab Results  Component Value Date   HGBA1C 5.5 05/31/2020   Stable, pt to continue current medical treatment  - diet and wt control

## 2020-06-06 NOTE — Assessment & Plan Note (Addendum)
Last vitamin D Lab Results  Component Value Date   VD25OH 32.06 05/31/2020   Low normal, advised to start vit d3 2000 u daily oral replacement

## 2020-06-06 NOTE — Addendum Note (Signed)
Addended by: Corwin Levins on: 06/06/2020 08:10 AM   Modules accepted: Orders

## 2020-06-06 NOTE — Assessment & Plan Note (Addendum)
Lab Results  Component Value Date   LDLCALC 79 05/31/2020   Mild uncontrolled with goal < 70, pt to increase current statin to lipitor 40

## 2020-12-01 ENCOUNTER — Other Ambulatory Visit: Payer: Self-pay | Admitting: Internal Medicine

## 2020-12-17 ENCOUNTER — Other Ambulatory Visit: Payer: Self-pay | Admitting: Internal Medicine

## 2020-12-17 DIAGNOSIS — Z1231 Encounter for screening mammogram for malignant neoplasm of breast: Secondary | ICD-10-CM

## 2020-12-28 DIAGNOSIS — L659 Nonscarring hair loss, unspecified: Secondary | ICD-10-CM | POA: Diagnosis not present

## 2021-01-08 ENCOUNTER — Ambulatory Visit
Admission: RE | Admit: 2021-01-08 | Discharge: 2021-01-08 | Disposition: A | Discharge status: Home / Self Care | Payer: Medicare Other | Source: Ambulatory Visit | Attending: Internal Medicine | Admitting: Internal Medicine

## 2021-01-08 ENCOUNTER — Other Ambulatory Visit: Payer: Self-pay

## 2021-01-08 DIAGNOSIS — Z1231 Encounter for screening mammogram for malignant neoplasm of breast: Secondary | ICD-10-CM | POA: Diagnosis not present

## 2021-01-28 ENCOUNTER — Other Ambulatory Visit: Payer: Self-pay | Admitting: Internal Medicine

## 2021-01-28 ENCOUNTER — Telehealth: Payer: Self-pay | Admitting: Internal Medicine

## 2021-01-28 DIAGNOSIS — R609 Edema, unspecified: Secondary | ICD-10-CM

## 2021-01-28 DIAGNOSIS — I10 Essential (primary) hypertension: Secondary | ICD-10-CM

## 2021-01-28 MED ORDER — VALSARTAN 80 MG PO TABS: ORAL_TABLET | ORAL | 1 refills | Status: DC

## 2021-01-28 MED ORDER — ATORVASTATIN CALCIUM 40 MG PO TABS
40.0000 mg | ORAL_TABLET | Freq: Every day | ORAL | 1 refills | Status: DC
Start: 2021-01-28 — End: 2021-06-05

## 2021-01-28 MED ORDER — FUROSEMIDE 20 MG PO TABS: ORAL_TABLET | ORAL | 1 refills | Status: DC

## 2021-02-01 NOTE — Telephone Encounter (Signed)
NA

## 2021-03-01 ENCOUNTER — Telehealth: Payer: Self-pay | Admitting: Internal Medicine

## 2021-03-01 NOTE — Telephone Encounter (Signed)
Patient states that she is going on a trip to [place] April 2023 and would like to know what vaccines are needed for her trip. Patient also inquiring if she needs prescription to get shingles vaccine at [facility].

## 2021-03-01 NOTE — Telephone Encounter (Signed)
Ok to let pt know -   Please contact the local health dept regarding requirements for her trip (or see the CDC website)  Also, no shiingrix rx is actually needed - this can be done on request at any participating pharmacy

## 2021-03-01 NOTE — Telephone Encounter (Signed)
Detailed message left on patient's voicemail notifying her of recommendations. Advised patient to give our office a call if any further questions

## 2021-03-01 NOTE — Telephone Encounter (Signed)
Patient requesting call back to discuss vaccines  Patient states she is available Mon-Fri 8am-12am

## 2021-03-07 DIAGNOSIS — Z23 Encounter for immunization: Secondary | ICD-10-CM | POA: Diagnosis not present

## 2021-03-16 DIAGNOSIS — Z20822 Contact with and (suspected) exposure to covid-19: Secondary | ICD-10-CM | POA: Diagnosis not present

## 2021-03-16 DIAGNOSIS — R0981 Nasal congestion: Secondary | ICD-10-CM | POA: Diagnosis not present

## 2021-03-16 DIAGNOSIS — J069 Acute upper respiratory infection, unspecified: Secondary | ICD-10-CM | POA: Diagnosis not present

## 2021-03-16 DIAGNOSIS — R051 Acute cough: Secondary | ICD-10-CM | POA: Diagnosis not present

## 2021-04-16 MED ORDER — TYPHOID VACCINE PO CPDR: 1.0000 | DELAYED_RELEASE_CAPSULE | ORAL | 0 refills | Status: DC

## 2021-04-16 NOTE — Addendum Note (Signed)
Addended by: Biagio Borg on: 04/16/2021 01:08 PM   Modules accepted: Orders

## 2021-04-16 NOTE — Telephone Encounter (Signed)
Patient states that she is going on a trip to [place] April 2023   Patient states the local health dept recommended she receive typhoid vaccine  Patient is requesting a rx for typhoid injection  Pharmacy [id number] - [place], [place] - [address] AT [facility] OF [address] & [address]

## 2021-04-16 NOTE — Telephone Encounter (Signed)
Left message for patient to call me back. 

## 2021-04-16 NOTE — Telephone Encounter (Signed)
We normally dont do rx for typhoid vaccine as this is normally done at the [facility], but I did send the rx for the PILLS that do the same thing to her [facility] on [address] if not too expensive

## 2021-04-19 NOTE — Telephone Encounter (Signed)
Patient inquiring why pills were prescribed and not vaccine  Advised patient of provider's message  Patient states she understood

## 2021-05-09 DIAGNOSIS — Z23 Encounter for immunization: Secondary | ICD-10-CM | POA: Diagnosis not present

## 2021-05-09 DIAGNOSIS — Z7189 Other specified counseling: Secondary | ICD-10-CM | POA: Diagnosis not present

## 2021-06-05 ENCOUNTER — Encounter: Payer: Self-pay | Admitting: Internal Medicine

## 2021-06-05 ENCOUNTER — Ambulatory Visit (INDEPENDENT_AMBULATORY_CARE_PROVIDER_SITE_OTHER): Payer: Medicare Other | Admitting: Internal Medicine

## 2021-06-05 ENCOUNTER — Other Ambulatory Visit: Payer: Self-pay

## 2021-06-05 VITALS — BP 118/68 | HR 80 | Temp 98.1°F | Ht 65.5 in | Wt 151.2 lb

## 2021-06-05 DIAGNOSIS — E7800 Pure hypercholesterolemia, unspecified: Secondary | ICD-10-CM | POA: Diagnosis not present

## 2021-06-05 DIAGNOSIS — R739 Hyperglycemia, unspecified: Secondary | ICD-10-CM | POA: Diagnosis not present

## 2021-06-05 DIAGNOSIS — M1712 Unilateral primary osteoarthritis, left knee: Secondary | ICD-10-CM | POA: Diagnosis not present

## 2021-06-05 DIAGNOSIS — E559 Vitamin D deficiency, unspecified: Secondary | ICD-10-CM | POA: Diagnosis not present

## 2021-06-05 DIAGNOSIS — M1711 Unilateral primary osteoarthritis, right knee: Secondary | ICD-10-CM | POA: Diagnosis not present

## 2021-06-05 DIAGNOSIS — R609 Edema, unspecified: Secondary | ICD-10-CM

## 2021-06-05 DIAGNOSIS — Z0001 Encounter for general adult medical examination with abnormal findings: Secondary | ICD-10-CM | POA: Diagnosis not present

## 2021-06-05 DIAGNOSIS — I10 Essential (primary) hypertension: Secondary | ICD-10-CM | POA: Diagnosis not present

## 2021-06-05 LAB — URINALYSIS, ROUTINE W REFLEX MICROSCOPIC
Bilirubin Urine: NEGATIVE
Hgb urine dipstick: NEGATIVE
Ketones, ur: NEGATIVE
Leukocytes,Ua: NEGATIVE
Nitrite: NEGATIVE
RBC / HPF: NONE SEEN (ref 0–?)
Specific Gravity, Urine: 1.025 (ref 1.000–1.030)
Total Protein, Urine: NEGATIVE
Urine Glucose: NEGATIVE
Urobilinogen, UA: 0.2 (ref 0.0–1.0)
pH: 5.5 (ref 5.0–8.0)

## 2021-06-05 LAB — BASIC METABOLIC PANEL
BUN: 24 mg/dL — ABNORMAL HIGH (ref 6–23)
CO2: 28 mEq/L (ref 19–32)
Calcium: 9.2 mg/dL (ref 8.4–10.5)
Chloride: 103 mEq/L (ref 96–112)
Creatinine, Ser: 0.85 mg/dL (ref 0.40–1.20)
GFR: 69.41 mL/min (ref >60.00)
Glucose, Bld: 92 mg/dL (ref 70–99)
Potassium: 3.9 mEq/L (ref 3.5–5.1)
Sodium: 142 mEq/L (ref 135–145)

## 2021-06-05 LAB — CBC WITH DIFFERENTIAL/PLATELET
Basophils Absolute: 0 10*3/uL (ref 0.0–0.1)
Basophils Relative: 1.1 % (ref 0.0–3.0)
Eosinophils Absolute: 0.2 10*3/uL (ref 0.0–0.7)
Eosinophils Relative: 5.7 % — ABNORMAL HIGH (ref 0.0–5.0)
HCT: 36.8 % (ref 36.0–46.0)
Hemoglobin: 12.4 g/dL (ref 12.0–15.0)
Lymphocytes Relative: 32.6 % (ref 12.0–46.0)
Lymphs Abs: 1 10*3/uL (ref 0.7–4.0)
MCHC: 33.6 g/dL (ref 30.0–36.0)
MCV: 97.6 fl (ref 78.0–100.0)
Monocytes Absolute: 0.2 10*3/uL (ref 0.1–1.0)
Monocytes Relative: 7.2 % (ref 3.0–12.0)
Neutro Abs: 1.6 10*3/uL (ref 1.4–7.7)
Neutrophils Relative %: 53.4 % (ref 43.0–77.0)
Platelets: 284 10*3/uL (ref 150.0–400.0)
RBC: 3.77 Mil/uL — ABNORMAL LOW (ref 3.87–5.11)
RDW: 13.5 % (ref 11.5–15.5)
WBC: 3.1 10*3/uL — ABNORMAL LOW (ref 4.0–10.5)

## 2021-06-05 LAB — HEPATIC FUNCTION PANEL
ALT: 16 U/L (ref 0–35)
AST: 23 U/L (ref 0–37)
Albumin: 4.5 g/dL (ref 3.5–5.2)
Alkaline Phosphatase: 83 U/L (ref 39–117)
Bilirubin, Direct: 0.1 mg/dL (ref 0.0–0.3)
Total Bilirubin: 0.6 mg/dL (ref 0.2–1.2)
Total Protein: 7.2 g/dL (ref 6.0–8.3)

## 2021-06-05 LAB — LIPID PANEL
Cholesterol: 196 mg/dL (ref 0–200)
HDL: 96.9 mg/dL (ref >39.00)
LDL Cholesterol: 91 mg/dL (ref 0–99)
NonHDL: 99.36
Total CHOL/HDL Ratio: 2
Triglycerides: 42 mg/dL (ref 0.0–149.0)
VLDL: 8.4 mg/dL (ref 0.0–40.0)

## 2021-06-05 LAB — VITAMIN D 25 HYDROXY (VIT D DEFICIENCY, FRACTURES): VITD: 37.84 ng/mL (ref 30.00–100.00)

## 2021-06-05 LAB — HEMOGLOBIN A1C: Hgb A1c MFr Bld: 5.4 % (ref 4.6–6.5)

## 2021-06-05 LAB — TSH: TSH: 1.64 u[IU]/mL (ref 0.35–5.50)

## 2021-06-05 MED ORDER — ATORVASTATIN CALCIUM 40 MG PO TABS: 40.0000 mg | ORAL_TABLET | Freq: Every day | ORAL | 3 refills | Status: DC

## 2021-06-05 MED ORDER — VALSARTAN 80 MG PO TABS: ORAL_TABLET | ORAL | 3 refills | Status: DC

## 2021-06-05 MED ORDER — CELECOXIB 200 MG PO CAPS: 200.0000 mg | ORAL_CAPSULE | Freq: Two times a day (BID) | ORAL | 1 refills | Status: DC

## 2021-06-05 MED ORDER — FUROSEMIDE 20 MG PO TABS: ORAL_TABLET | ORAL | 3 refills | Status: DC

## 2021-06-05 NOTE — Assessment & Plan Note (Signed)
Last vitamin D ?Lab Results  ?Component Value Date  ? VD25OH 32.06 05/31/2020  ? ?Low, to start oral replacement ? ?

## 2021-06-05 NOTE — Patient Instructions (Addendum)

## 2021-06-05 NOTE — Progress Notes (Signed)
Patient ID: Christine Pitts, female   DOB: January 08, 1951, 71 y.o.   MRN: [id number] ? ? ? ?     Chief Complaint:: wellness exam and low vit d, left and right knee OA, htn, hld, hyperglycemia ? ?     HPI:  Christine Pitts is a 71 y.o. female here for wellness exam; going to the gym 6 days per wk but cant do more than she does due to persistent right and left knee pain, moderate, intermittent, worse to walk, better to sit, nothing else makes better or worse  Declines covid booster, o/w up to date ?              Also grieving improving after husband died 06-22-22with stroke and c diff colitis.  Going to [place] next mo.  Already had typhoid pills vax,  hep A vax.  Not taking Vit D.  Pt denies chest pain, increased sob or doe, wheezing, orthopnea, PND, increased LE swelling, palpitations, dizziness or syncope.   Pt denies polydipsia, polyuria, or new focal neuro s/s.   Pt denies fever, wt loss, night sweats, loss of appetite, or other constitutional symptoms Denies worsening reflux, abd pain, dysphagia, n/v, bowel change or blood.  Denies worsening depressive symptoms, suicidal ideation, or panic.    ?  ?Wt Readings from Last 3 Encounters:  ?06/05/21 151 lb 3.2 oz (68.6 kg)  ?05/31/20 155 lb (70.3 kg)  ?11/29/19 162 lb (73.5 kg)  ? ?BP Readings from Last 3 Encounters:  ?06/05/21 118/68  ?05/31/20 138/78  ?11/29/19 102/70  ? ?Immunization History  ?Administered Date(s) Administered  ? Influenza, High Dose Seasonal PF 12/30/2017, 11/15/2018  ? Influenza-Unspecified 11/15/2018, 01/30/2020, 02/18/2021  ? PFIZER(Purple Top)SARS-COV-2 Vaccination 05/24/2019, 06/14/2019, 01/16/2020, 12/31/2020  ? Pneumococcal Conjugate-13 05/17/2018  ? Pneumococcal Polysaccharide-23 05/31/2020  ? Pneumococcal-Unspecified 03/04/2021  ? Tdap 02/10/2018  ? Zoster Recombinat (Shingrix) 03/04/2021, 04/01/2021  ? ?There are no preventive care reminders to display for this patient. ? ?  ? ?Past Medical History:  ?Diagnosis Date  ? Anemia   ? past hx of  anemia  ? Arthritis   ? Chicken pox   ? HLD (hyperlipidemia) 11/17/2018  ? Hypertension   ? ?Past Surgical History:  ?Procedure Laterality Date  ? CARPAL TUNNEL RELEASE Left 06/22/2017  ? Procedure: LEFT CARPAL TUNNEL RELEASE;  Surgeon: Betha Loa, MD;  Location: [facility];  Service: Orthopedics;  Laterality: Left;  ? COLONOSCOPY    ? 10 + yrs ago was normal per pt   ? TONSILLECTOMY  1962  ? TUBAL LIGATION  1982  ? ? reports that she has never smoked. She has never used smokeless tobacco. She reports that she does not drink alcohol and does not use drugs. ?family history includes Arthritis in her mother; Heart attack in her mother; Hypertension in her father; Stroke in her mother. ?No Known Allergies ?Current Outpatient Medications on File Prior to Visit  ?Medication Sig Dispense Refill  ? aspirin 81 MG EC tablet 1 tab by mouth once daily 30 tablet 99  ? bisacodyl (DULCOLAX) 5 MG EC tablet Take 5 mg by mouth once. For golytely prep x 4 for colon 9-25    ? Cholecalciferol (THERA-D 2000) 50 MCG (2000 UT) TABS 1 tab by mouth once daily 30 tablet 99  ? ?No current facility-administered medications on file prior to visit.  ? ?     ROS:  All others reviewed and negative. ? ?Objective  ? ?  PE:  BP 118/68 (BP Location: Right Arm, Patient Position: Sitting, Cuff Size: Normal)   Pulse 80   Temp 98.1 ?F (36.7 ?C) (Oral)   Ht 5' 5.5" (1.664 m)   Wt 151 lb 3.2 oz (68.6 kg)   SpO2 98%   BMI 24.78 kg/m?  ? ?              Constitutional: Pt appears in NAD ?              HENT: Head: NCAT.  ?              Right Ear: External ear normal.   ?              Left Ear: External ear normal.  ?              Eyes: . Pupils are equal, round, and reactive to light. Conjunctivae and EOM are normal ?              Nose: without d/c or deformity ?              Neck: Neck supple. Gross normal ROM ?              Cardiovascular: Normal rate and regular rhythm.   ?              Pulmonary/Chest: Effort normal and breath sounds  without rales or wheezing.  ?              Abd:  Soft, NT, ND, + BS, no organomegaly ?              Neurological: Pt is alert. At baseline orientation, motor grossly intact ?              Skin: Skin is warm. No rashes, no other new lesions, LE edema - none ?              Psychiatric: Pt behavior is normal without agitation  ? ?Micro: none ? ?Cardiac tracings I have personally interpreted today:  none ? ?Pertinent Radiological findings (summarize): none  ? ?Lab Results  ?Component Value Date  ? WBC 3.1 (L) 06/05/2021  ? HGB 12.4 06/05/2021  ? HCT 36.8 06/05/2021  ? PLT 284.0 06/05/2021  ? GLUCOSE 92 06/05/2021  ? CHOL 196 06/05/2021  ? TRIG 42.0 06/05/2021  ? HDL 96.90 06/05/2021  ? LDLCALC 91 06/05/2021  ? ALT 16 06/05/2021  ? AST 23 06/05/2021  ? NA 142 06/05/2021  ? K 3.9 06/05/2021  ? CL 103 06/05/2021  ? CREATININE 0.85 06/05/2021  ? BUN 24 (H) 06/05/2021  ? CO2 28 06/05/2021  ? TSH 1.64 06/05/2021  ? HGBA1C 5.4 06/05/2021  ? ?Assessment/Plan:  ?[name] is a 71 y.o. Black or African American [2] female with  has a past medical history of Anemia, Arthritis, Chicken pox, HLD (hyperlipidemia) (11/17/2018), and Hypertension. ? ?Vitamin D deficiency ?Last vitamin D ?Lab Results  ?Component Value Date  ? VD25OH 32.06 05/31/2020  ? ?Low, to start oral replacement ? ? ?Encounter for well adult exam with abnormal findings ?Age and sex appropriate education and counseling updated with regular exercise and diet ?Referrals for preventative services - none needed ?Immunizations addressed - declines covid booster ?Smoking counseling  - none needed ?Evidence for depression or other mood disorder - none significant ?Most recent labs reviewed. ?I have personally reviewed and have noted: ?1) the patient's medical and social history ?2) The patient's  current medications and supplements ?3) The patient's height, weight, and BMI have been recorded in the chart ? ? ?Primary osteoarthritis of left knee ?Mild to mod, for restart  celebrex, f/u sport med if persists ? ?Primary osteoarthritis of right knee ?Mild to mod, for restart celebrex, f/u sport med if persists ? ?Hypertensive disorder ?BP Readings from Last 3 Encounters:  ?06/05/21 118/68  ?05/31/20 138/78  ?11/29/19 102/70  ? ?Stable, pt to continue medical treatment diovan ? ? ?Hyperlipidemia ?Lab Results  ?Component Value Date  ? LDLCALC 91 06/05/2021  ? ?Stable, pt to continue current statin lipitor 40 ? ? ?Hyperglycemia ?Lab Results  ?Component Value Date  ? HGBA1C 5.4 06/05/2021  ? ?Stable, pt to continue current medical treatment  - diet ? ?Followup: Return in about 1 year (around 06/06/2022). ? ?Oliver BarreJames Mitzy Naron, MD 06/09/2021 5:13 PM ?[facility] ?[facility] ?Internal Medicine ?

## 2021-06-09 ENCOUNTER — Encounter: Payer: Self-pay | Admitting: Internal Medicine

## 2021-06-09 NOTE — Assessment & Plan Note (Signed)
Mild to mod, for restart celebrex, f/u sport med if persists ?

## 2021-06-09 NOTE — Assessment & Plan Note (Signed)
Mild to mod, for restart celebrex, f/u sport med if persists ?

## 2021-06-09 NOTE — Assessment & Plan Note (Signed)
Lab Results  ?Component Value Date  ? [place] 91 06/05/2021  ? ?Stable, pt to continue current statin lipitor 40 ? ?

## 2021-06-09 NOTE — Assessment & Plan Note (Signed)
BP Readings from Last 3 Encounters:  ?06/05/21 118/68  ?05/31/20 138/78  ?11/29/19 102/70  ? ?Stable, pt to continue medical treatment diovan ? ?

## 2021-06-09 NOTE — Assessment & Plan Note (Signed)

## 2021-06-09 NOTE — Assessment & Plan Note (Signed)
Lab Results  ?Component Value Date  ? HGBA1C 5.4 06/05/2021  ? ?Stable, pt to continue current medical treatment  - diet ? ?

## 2021-08-01 DIAGNOSIS — L668 Other cicatricial alopecia: Secondary | ICD-10-CM | POA: Diagnosis not present

## 2021-08-01 DIAGNOSIS — L649 Androgenic alopecia, unspecified: Secondary | ICD-10-CM | POA: Diagnosis not present

## 2021-12-26 ENCOUNTER — Other Ambulatory Visit: Payer: Self-pay | Admitting: Internal Medicine

## 2021-12-26 DIAGNOSIS — Z1231 Encounter for screening mammogram for malignant neoplasm of breast: Secondary | ICD-10-CM

## 2022-01-09 ENCOUNTER — Ambulatory Visit
Admission: RE | Admit: 2022-01-09 | Discharge: 2022-01-09 | Disposition: A | Discharge status: Home / Self Care | Payer: Medicare Other | Source: Ambulatory Visit | Attending: Internal Medicine | Admitting: Internal Medicine

## 2022-01-09 DIAGNOSIS — Z1231 Encounter for screening mammogram for malignant neoplasm of breast: Secondary | ICD-10-CM

## 2022-06-10 ENCOUNTER — Ambulatory Visit (INDEPENDENT_AMBULATORY_CARE_PROVIDER_SITE_OTHER): Payer: Medicare Other | Admitting: Internal Medicine

## 2022-06-10 ENCOUNTER — Encounter: Payer: Self-pay | Admitting: Internal Medicine

## 2022-06-10 VITALS — BP 122/78 | HR 65 | Temp 98.2°F | Ht 65.5 in | Wt 159.0 lb

## 2022-06-10 DIAGNOSIS — I10 Essential (primary) hypertension: Secondary | ICD-10-CM

## 2022-06-10 DIAGNOSIS — Z0001 Encounter for general adult medical examination with abnormal findings: Secondary | ICD-10-CM | POA: Diagnosis not present

## 2022-06-10 DIAGNOSIS — R739 Hyperglycemia, unspecified: Secondary | ICD-10-CM | POA: Diagnosis not present

## 2022-06-10 DIAGNOSIS — E7800 Pure hypercholesterolemia, unspecified: Secondary | ICD-10-CM | POA: Diagnosis not present

## 2022-06-10 DIAGNOSIS — E538 Deficiency of other specified B group vitamins: Secondary | ICD-10-CM

## 2022-06-10 DIAGNOSIS — E559 Vitamin D deficiency, unspecified: Secondary | ICD-10-CM

## 2022-06-10 LAB — BASIC METABOLIC PANEL
BUN: 30 mg/dL — ABNORMAL HIGH (ref 6–23)
CO2: 27 mEq/L (ref 19–32)
Calcium: 9.5 mg/dL (ref 8.4–10.5)
Chloride: 103 mEq/L (ref 96–112)
Creatinine, Ser: 0.95 mg/dL (ref 0.40–1.20)
GFR: 60.31 mL/min (ref >60.00)
Glucose, Bld: 94 mg/dL (ref 70–99)
Potassium: 4.5 mEq/L (ref 3.5–5.1)
Sodium: 141 mEq/L (ref 135–145)

## 2022-06-10 LAB — CBC WITH DIFFERENTIAL/PLATELET
Basophils Absolute: 0 10*3/uL (ref 0.0–0.1)
Basophils Relative: 1.1 % (ref 0.0–3.0)
Eosinophils Absolute: 0.2 10*3/uL (ref 0.0–0.7)
Eosinophils Relative: 5.8 % — ABNORMAL HIGH (ref 0.0–5.0)
HCT: 37.5 % (ref 36.0–46.0)
Hemoglobin: 12.7 g/dL (ref 12.0–15.0)
Lymphocytes Relative: 24.3 % (ref 12.0–46.0)
Lymphs Abs: 1 10*3/uL (ref 0.7–4.0)
MCHC: 33.8 g/dL (ref 30.0–36.0)
MCV: 98.8 fl (ref 78.0–100.0)
Monocytes Absolute: 0.2 10*3/uL (ref 0.1–1.0)
Monocytes Relative: 5.7 % (ref 3.0–12.0)
Neutro Abs: 2.6 10*3/uL (ref 1.4–7.7)
Neutrophils Relative %: 63.1 % (ref 43.0–77.0)
Platelets: 338 10*3/uL (ref 150.0–400.0)
RBC: 3.8 Mil/uL — ABNORMAL LOW (ref 3.87–5.11)
RDW: 13.5 % (ref 11.5–15.5)
WBC: 4.1 10*3/uL (ref 4.0–10.5)

## 2022-06-10 LAB — URINALYSIS, ROUTINE W REFLEX MICROSCOPIC
Bilirubin Urine: NEGATIVE
Hgb urine dipstick: NEGATIVE
Ketones, ur: NEGATIVE
Leukocytes,Ua: NEGATIVE
Nitrite: NEGATIVE
RBC / HPF: NONE SEEN (ref 0–?)
Specific Gravity, Urine: 1.025 (ref 1.000–1.030)
Total Protein, Urine: NEGATIVE
Urine Glucose: NEGATIVE
Urobilinogen, UA: 0.2 (ref 0.0–1.0)
pH: 6 (ref 5.0–8.0)

## 2022-06-10 LAB — HEPATIC FUNCTION PANEL
ALT: 15 U/L (ref 0–35)
AST: 20 U/L (ref 0–37)
Albumin: 4.2 g/dL (ref 3.5–5.2)
Alkaline Phosphatase: 87 U/L (ref 39–117)
Bilirubin, Direct: 0.1 mg/dL (ref 0.0–0.3)
Total Bilirubin: 0.5 mg/dL (ref 0.2–1.2)
Total Protein: 7.1 g/dL (ref 6.0–8.3)

## 2022-06-10 LAB — LIPID PANEL
Cholesterol: 192 mg/dL (ref 0–200)
HDL: 87.7 mg/dL (ref >39.00)
LDL Cholesterol: 94 mg/dL (ref 0–99)
NonHDL: 104.25
Total CHOL/HDL Ratio: 2
Triglycerides: 51 mg/dL (ref 0.0–149.0)
VLDL: 10.2 mg/dL (ref 0.0–40.0)

## 2022-06-10 LAB — TSH: TSH: 2.05 u[IU]/mL (ref 0.35–5.50)

## 2022-06-10 LAB — HEMOGLOBIN A1C: Hgb A1c MFr Bld: 5.5 % (ref 4.6–6.5)

## 2022-06-10 MED ORDER — ATORVASTATIN CALCIUM 40 MG PO TABS: 40.0000 mg | ORAL_TABLET | Freq: Every day | ORAL | 3 refills | Status: DC

## 2022-06-10 MED ORDER — VALSARTAN 80 MG PO TABS: ORAL_TABLET | ORAL | 3 refills | Status: DC

## 2022-06-10 MED ORDER — FUROSEMIDE 20 MG PO TABS: ORAL_TABLET | ORAL | 3 refills | Status: DC

## 2022-06-10 NOTE — Progress Notes (Signed)
Patient ID: Christine Pitts, female   DOB: 09/22/1950, 72 y.o.   MRN: [id number]         Chief Complaint:: wellness exam and hyperglycemia, hld, htn, low vit d       HPI:  Christine Pitts is a 72 y.o. female here for wellness exam; keeping active with gym 2 hrs per day, o/w up to date                         Also still working part time for school system.  Husband died x 2 yrs,  Misses him not depressed.  Keeping active.  T[facility] grave [facility] per month at [facility].  Pt denies chest pain, increased sob or doe, wheezing, orthopnea, PND, increased LE swelling, palpitations, dizziness or syncope.   Pt denies polydipsia, polyuria, or new focal neuro s/s.    Pt denies fever, wt loss, night sweats, loss of appetite, or other constitutional symptoms     Wt Readings from Last 3 Encounters:  06/10/22 159 lb (72.1 kg)  06/05/21 151 lb 3.2 oz (68.6 kg)  05/31/20 155 lb (70.3 kg)   BP Readings from Last 3 Encounters:  06/10/22 122/78  06/05/21 118/68  05/31/20 138/78   Immunization History  Administered Date(s) Administered   COVID-19, mRNA, vaccine(Comirnaty)12 years and older 12/30/2021   Influenza, High Dose Seasonal PF 12/30/2017, 11/15/2018, 01/17/2022   Influenza-Unspecified 11/15/2018, 01/30/2020, 02/18/2021   PFIZER(Purple Top)SARS-COV-2 Vaccination 05/24/2019, 06/14/2019, 01/16/2020, 12/31/2020   Pneumococcal Conjugate-13 05/17/2018   Pneumococcal Polysaccharide-23 05/31/2020   Pneumococcal-Unspecified 03/04/2021   Tdap 02/10/2018   Zoster Recombinat (Shingrix) 03/04/2021, 04/01/2021   Health Maintenance Due  Topic Date Due   Medicare Annual Wellness (AWV)  Never done      Past Medical History:  Diagnosis Date   Anemia    past hx of anemia   Arthritis    Chicken pox    HLD (hyperlipidemia) 11/17/2018   Hypertension    Past Surgical History:  Procedure Laterality Date   CARPAL TUNNEL RELEASE Left 06/22/2017   Procedure: LEFT CARPAL TUNNEL RELEASE;  Surgeon: Leanora Cover, MD;  Location: [facility];  Service: Orthopedics;  Laterality: Left;   COLONOSCOPY     10 + yrs ago was normal per pt    [name]    reports that she has never smoked. She has never used smokeless tobacco. She reports that she does not drink alcohol and does not use drugs. family history includes Arthritis in her mother; Heart attack in her mother; Hypertension in her father; Stroke in her mother. No Known Allergies Current Outpatient Medications on File Prior to Visit  Medication Sig Dispense Refill   aspirin 81 MG EC tablet 1 tab by mouth once daily 30 tablet 99   bisacodyl (DULCOLAX) 5 MG EC tablet Take 5 mg by mouth once. For golytely prep x 4 for colon 9-25     celecoxib (CELEBREX) 200 MG capsule Take 1 capsule (200 mg total) by mouth 2 (two) times daily. 180 capsule 1   Cholecalciferol (THERA-D 2000) 50 MCG (2000 UT) TABS 1 tab by mouth once daily 30 tablet 99   hydrochlorothiazide (HYDRODIURIL) 25 MG tablet      No current facility-administered medications on file prior to visit.        ROS:  All others reviewed and negative.  Objective  PE:  BP 122/78   Pulse 65   Temp 98.2 F (36.8 C) (Oral)   Ht 5' 5.5" (1.664 m)   Wt 159 lb (72.1 kg)   SpO2 99%   BMI 26.06 kg/m                 Constitutional: Pt appears in NAD               HENT: Head: NCAT.                Right Ear: External ear normal.                 Left Ear: External ear normal.                Eyes: . Pupils are equal, round, and reactive to light. Conjunctivae and EOM are normal               Nose: without d/c or deformity               Neck: Neck supple. Gross normal ROM               Cardiovascular: Normal rate and regular rhythm.                 Pulmonary/Chest: Effort normal and breath sounds without rales or wheezing.                Abd:  Soft, NT, ND, + BS, no organomegaly               Neurological: Pt is alert. At baseline orientation,  motor grossly intact               Skin: Skin is warm. No rashes, no other new lesions, LE edema - none               Psychiatric: Pt behavior is normal without agitation   Micro: none  Cardiac tracings I have personally interpreted today:  none  Pertinent Radiological findings (summarize): none   Lab Results  Component Value Date   WBC 4.1 06/10/2022   HGB 12.7 06/10/2022   HCT 37.5 06/10/2022   PLT 338.0 06/10/2022   GLUCOSE 94 06/10/2022   CHOL 192 06/10/2022   TRIG 51.0 06/10/2022   HDL 87.70 06/10/2022   LDLCALC 94 06/10/2022   ALT 15 06/10/2022   AST 20 06/10/2022   NA 141 06/10/2022   K 4.5 06/10/2022   CL 103 06/10/2022   CREATININE 0.95 06/10/2022   BUN 30 (H) 06/10/2022   CO2 27 06/10/2022   TSH 2.05 06/10/2022   HGBA1C 5.5 06/10/2022   Assessment/Plan:  Christine Pitts is a 72 y.o. Black or African American [2] female with  has a past medical history of Anemia, Arthritis, Chicken pox, HLD (hyperlipidemia) (11/17/2018), and Hypertension.  Encounter for well adult exam with abnormal findings Age and sex appropriate education and counseling updated with regular exercise and diet Referrals for preventative services - none needed Immunizations addressed - none needed Smoking counseling  - none needed Evidence for depression or other mood disorder - none significant Most recent labs reviewed. I have personally reviewed and have noted: 1) the patient's medical and social history 2) The patient's current medications and supplements 3) The patient's height, weight, and BMI have been recorded in the chart   Hyperglycemia Lab Results  Component Value Date   HGBA1C 5.5 06/10/2022   Stable, pt to continue current medical treatment  -  diet, wt control   Hyperlipidemia Lab Results  Component Value Date   LDLCALC 94 06/10/2022   Stable, pt to continue current statin lipitor 40 mg qd   Hypertensive disorder BP Readings from Last 3 Encounters:  06/10/22 122/78   06/05/21 118/68  05/31/20 138/78   Stable, pt to continue medical treatment hct 25 mg qd, diovan 80 mg qd   Vitamin D deficiency Last vitamin D Lab Results  Component Value Date   VD25OH 25.13 (L) 06/10/2022   Low, to double oral replacement to 4000 u qd  Followup: Return in about 1 year (around 06/10/2023).  Cathlean Cower, MD 06/13/2022 1:20 PM [facility]

## 2022-06-10 NOTE — Patient Instructions (Signed)
Ok to increase the vit D3 to 4000 units per day  Please continue all other medications as before, and refills have been done if requested.  Please have the pharmacy call with any other refills you may need.  Please continue your efforts at being more active, low cholesterol diet, and weight control.  You are otherwise up to date with prevention measures today.  Please keep your appointments with your specialists as you may have planned  Please go to the LAB at the blood drawing area for the tests to be done  You will be contacted by phone if any changes need to be made immediately.  Otherwise, you will receive a letter about your results with an explanation, but please check with MyChart first.  Please remember to sign up for MyChart if you have not done so, as this will be important to you in the future with finding out test results, communicating by private email, and scheduling acute appointments online when needed.  Please make an Appointment to return for your 1 year visit, or sooner if needed, with Lab testing by Appointment as well, to be done about 3-5 days before at the [facility] (so this is for TWO appointments - please see the scheduling desk as you leave)

## 2022-06-11 ENCOUNTER — Encounter: Payer: Self-pay | Admitting: Internal Medicine

## 2022-06-11 LAB — VITAMIN D 25 HYDROXY (VIT D DEFICIENCY, FRACTURES): VITD: 25.13 ng/mL — ABNORMAL LOW (ref 30.00–100.00)

## 2022-06-11 LAB — VITAMIN B12: Vitamin B-12: 368 pg/mL (ref 211–911)

## 2022-06-13 ENCOUNTER — Encounter: Payer: Self-pay | Admitting: Internal Medicine

## 2022-06-13 NOTE — Assessment & Plan Note (Signed)

## 2022-06-13 NOTE — Assessment & Plan Note (Signed)
Lab Results  Component Value Date   LDLCALC 94 06/10/2022   Stable, pt to continue current statin lipitor 40 mg qd

## 2022-06-13 NOTE — Assessment & Plan Note (Signed)
Lab Results  Component Value Date   HGBA1C 5.5 06/10/2022   Stable, pt to continue current medical treatment  - diet, wt control

## 2022-06-13 NOTE — Assessment & Plan Note (Signed)
Last vitamin D Lab Results  Component Value Date   VD25OH 25.13 (L) 06/10/2022   Low, to double oral replacement to 4000 u qd

## 2022-06-13 NOTE — Assessment & Plan Note (Signed)
BP Readings from Last 3 Encounters:  06/10/22 122/78  06/05/21 118/68  05/31/20 138/78   Stable, pt to continue medical treatment hct 25 mg qd, diovan 80 mg qd

## 2022-11-25 ENCOUNTER — Ambulatory Visit: Payer: Medicare Other

## 2022-11-25 VITALS — Ht 65.5 in | Wt 159.0 lb

## 2022-11-25 DIAGNOSIS — Z0000 Encounter for general adult medical examination without abnormal findings: Secondary | ICD-10-CM

## 2022-11-25 DIAGNOSIS — Z13820 Encounter for screening for osteoporosis: Secondary | ICD-10-CM | POA: Diagnosis not present

## 2022-11-25 NOTE — Progress Notes (Signed)
Subjective:   Christine Pitts is a 72 y.o. female who presents for Medicare Annual (Subsequent) preventive examination.  Visit Complete: Virtual  I connected with  Christine Pitts on 11/25/22 by a audio enabled telemedicine application and verified that I am speaking with the correct person using two identifiers.  Patient Location: Home  Provider Location: Office/Clinic  I discussed the limitations of evaluation and management by telemedicine. The patient expressed understanding and agreed to proceed.  Vital Signs: Because this visit was a virtual/telehealth visit, some criteria may be missing or patient reported. Any vitals not documented were not able to be obtained and vitals that have been documented are patient reported.    Review of Systems     Cardiac Risk Factors include: advanced age (>68men, >59 women);dyslipidemia;family history of premature cardiovascular disease;hypertension     Objective:    Today's Vitals   11/25/22 0931 11/25/22 0933  Weight: 159 lb (72.1 kg)   Height: 5' 5.5" (1.664 m)   PainSc: 2  2   PainLoc: Knee    Body mass index is 26.06 kg/m.     11/25/2022    9:34 AM 06/22/2017   11:48 AM 06/15/2017   11:42 AM  Advanced Directives  Does Patient Have a Medical Advance Directive? No No No  Would patient like information on creating a medical advance directive? No - Patient declined Yes (Inpatient - patient requests chaplain consult to create a medical advance directive)     Current Medications (verified) Outpatient Encounter Medications as of 11/25/2022  Medication Sig   aspirin 81 MG EC tablet 1 tab by mouth once daily   atorvastatin (LIPITOR) 40 MG tablet Take 1 tablet (40 mg total) by mouth daily.   bisacodyl (DULCOLAX) 5 MG EC tablet Take 5 mg by mouth once. For golytely prep x 4 for colon 9-25   celecoxib (CELEBREX) 200 MG capsule Take 1 capsule (200 mg total) by mouth 2 (two) times daily.   Cholecalciferol (THERA-D 2000) 50 MCG (2000 UT)  TABS 1 tab by mouth once daily   furosemide (LASIX) 20 MG tablet TAKE 1 TABLET(20 MG) BY MOUTH DAILY as needed   hydrochlorothiazide (HYDRODIURIL) 25 MG tablet    valsartan (DIOVAN) 80 MG tablet TAKE 1 TABLET(80 MG) BY MOUTH DAILY   No facility-administered encounter medications on file as of 11/25/2022.    Allergies (verified) Patient has no known allergies.   History: Past Medical History:  Diagnosis Date   Anemia    past hx of anemia   Arthritis    Chicken pox    HLD (hyperlipidemia) 11/17/2018   Hypertension    Past Surgical History:  Procedure Laterality Date   CARPAL TUNNEL RELEASE Left 06/22/2017   Procedure: LEFT CARPAL TUNNEL RELEASE;  Surgeon: Betha Loa, MD;  Location: [facility];  Service: Orthopedics;  Laterality: Left;   COLONOSCOPY     10 + yrs ago was normal per pt    TONSILLECTOMY  1962   TUBAL LIGATION  1982   Family History  Problem Relation Age of Onset   Arthritis Mother    Heart attack Mother    Stroke Mother    Hypertension Father    Colon cancer Neg Hx    Colon polyps Neg Hx    Esophageal cancer Neg Hx    Rectal cancer Neg Hx    Stomach cancer Neg Hx    Breast cancer Neg Hx    Social History   Socioeconomic History  Marital status: Married    Spouse name: Not on file   Number of children: Not on file   Years of education: Not on file   Highest education level: Not on file  Occupational History   Not on file  Tobacco Use   Smoking status: Never   Smokeless tobacco: Never  Vaping Use   Vaping status: Never Used  Substance and Sexual Activity   Alcohol use: No   Drug use: No   Sexual activity: Not on file  Other Topics Concern   Not on file  Social History Narrative   Not on file   Social Determinants of Health   Financial Resource Strain: Low Risk  (11/25/2022)   Overall Financial Resource Strain (CARDIA)    Difficulty of Paying Living Expenses: Not hard at all  Food Insecurity: No Food Insecurity (11/25/2022)    Hunger Vital Sign    Worried About Running Out of Food in the Last Year: Never true    Ran Out of Food in the Last Year: Never true  Transportation Needs: No Transportation Needs (11/25/2022)   PRAPARE - Administrator, Civil Service (Medical): No    Lack of Transportation (Non-Medical): No  Physical Activity: Sufficiently Active (11/25/2022)   Exercise Vital Sign    Days of Exercise per Week: 5 days    Minutes of Exercise per Session: 60 min  Stress: No Stress Concern Present (11/25/2022)   [employer] of Occupational Health - Occupational Stress Questionnaire    Feeling of Stress : Not at all  Social Connections: Moderately Integrated (11/25/2022)   Social Connection and Isolation Panel [NHANES]    Frequency of Communication with Friends and Family: More than three times a week    Frequency of Social Gatherings with Friends and Family: More than three times a week    Attends Religious Services: More than 4 times per year    Active Member of [employer] or Organizations: Yes    Attends Banker Meetings: More than 4 times per year    Marital Status: Widowed    Tobacco Counseling Counseling given: Not Answered   Clinical Intake:  Pre-visit preparation completed: Yes  Pain : 0-10 Pain Score: 2  Pain Type: Chronic pain Pain Location: Knee Pain Orientation: Left     BMI - recorded: 26.06 Nutritional Status: BMI 25 -29 Overweight Nutritional Risks: None Diabetes: No  How often do you need to have someone help you when you read instructions, pamphlets, or other written materials from your doctor or pharmacy?: 1 - Never What is the last grade level you completed in school?: HSG  Interpreter Needed?: No  Information entered by :: Susie Cassette, LPN.   Activities of Daily Living    11/25/2022    9:37 AM  In your present state of health, do you have any difficulty performing the following activities:  Hearing? 0  Vision? 0  Difficulty  concentrating or making decisions? 0  Walking or climbing stairs? 0  Dressing or bathing? 0  Doing errands, shopping? 0  Preparing Food and eating ? N  Using the Toilet? N  In the past six months, have you accidently leaked urine? N  Do you have problems with loss of bowel control? N  Managing your Medications? N  Managing your Finances? N  Housekeeping or managing your Housekeeping? N    Patient Care Team: Corwin Levins, MD as PCP - General (Internal Medicine) Conley Rolls, [facility] [place], [place] as Referring Physician (Optometry)  Indicate any recent Medical Services you may have received from other than [facility] providers in the past year (date may be approximate).     Assessment:   This is a routine wellness examination for [place].  Hearing/Vision screen Hearing Screening - Comments:: Patient denied any hearing difficulty.   No hearing aids.   Vision Screening - Comments:: Patient does wear corrective lenses/contacts.  Annual eye exam done by: [facility], OD .   Dietary issues and exercise activities discussed:     Goals Addressed               This Visit's Progress     Patient Stated (pt-stated)        Continue to stay physically active, independent and enjoy church.      Depression Screen    11/25/2022    9:36 AM 06/10/2022    8:07 AM 06/05/2021    8:21 AM 06/05/2021    8:04 AM 05/31/2020    8:25 AM 11/29/2019    8:36 AM 05/24/2019    8:22 AM  PHQ 2/9 Scores  PHQ - 2 Score 0 0 1 0 0 0 0  PHQ- 9 Score 0 0  0       Fall Risk    11/25/2022    9:35 AM 06/10/2022    8:08 AM 06/05/2021    8:21 AM 06/05/2021    8:03 AM 05/31/2020    8:25 AM  Fall Risk   Falls in the past year? 0 0 0 0 0  Number falls in past yr: 0 0 0 0   Injury with Fall? 0 0 0 0   Risk for fall due to : No Fall Risks No Fall Risks     Follow up Falls prevention discussed Falls evaluation completed;Education provided       MEDICARE RISK AT HOME: Medicare Risk at Home Any stairs in or around the home?: No If so,  are there any without handrails?: No Home free of loose throw rugs in walkways, pet beds, electrical cords, etc?: Yes Adequate lighting in your home to reduce risk of falls?: Yes Life alert?: No Use of a cane, walker or w/c?: No Grab bars in the bathroom?: No Shower chair or bench in shower?: No Elevated toilet seat or a handicapped toilet?: No  TIMED UP AND GO:  Was the test performed?  No    Cognitive Function:        11/25/2022    9:38 AM  6CIT Screen  What Year? 0 points  What month? 0 points  What time? 0 points  Count back from 20 0 points  Months in reverse 0 points  Repeat phrase 0 points  Total Score 0 points    Immunizations Immunization History  Administered Date(s) Administered   COVID-19, mRNA, vaccine(Comirnaty)12 years and older 12/30/2021   Influenza, High Dose Seasonal PF 12/30/2017, 11/15/2018, 01/17/2022   Influenza-Unspecified 11/15/2018, 01/30/2020, 02/18/2021   PFIZER(Purple Top)SARS-COV-2 Vaccination 05/24/2019, 06/14/2019, 01/16/2020, 12/31/2020   Pneumococcal Conjugate-13 05/17/2018   Pneumococcal Polysaccharide-23 05/31/2020   Pneumococcal-Unspecified 03/04/2021   Tdap 02/10/2018   Zoster Recombinant(Shingrix) 03/04/2021, 04/01/2021    TDAP status: Up to date  Flu Vaccine status: Due, Education has been provided regarding the importance of this vaccine. Advised may receive this vaccine at local pharmacy or Health Dept. Aware to provide a copy of the vaccination record if obtained from local pharmacy or Health Dept. Verbalized acceptance and understanding.  Pneumococcal vaccine status: Up to date  Covid-19 vaccine  status: Completed vaccines  Qualifies for Shingles Vaccine? Yes   Zostavax completed No   Shingrix Completed?: Yes  Screening Tests Health Maintenance  Topic Date Due   COVID-19 Vaccine (6 - 2023-24 season) 05/02/2022   INFLUENZA VACCINE  10/30/2022   Medicare Annual Wellness (AWV)  11/25/2023   MAMMOGRAM  01/10/2024    DTaP/Tdap/Td (2 - Td or Tdap) 02/11/2028   Colonoscopy  12/23/2028   Pneumonia Vaccine 80+ Years old  Completed   DEXA SCAN  Completed   Hepatitis C Screening  Completed   Zoster Vaccines- Shingrix  Completed   HPV VACCINES  Aged Out    Health Maintenance  Health Maintenance Due  Topic Date Due   COVID-19 Vaccine (6 - 2023-24 season) 05/02/2022   INFLUENZA VACCINE  10/30/2022    Colorectal cancer screening: Type of screening: Colonoscopy. Completed 12/24/2018. Repeat every 10 years  Mammogram status: Completed 01/09/2022. Repeat every year  Bone Density status: Ordered 11/25/2022. Pt provided with contact info and advised to call to schedule appt.  Lung Cancer Screening: (Low Dose CT Chest recommended if Age 69-80 years, 20 pack-year currently smoking OR have quit w/in 15years.) does not qualify.   Lung Cancer Screening Referral: no  Additional Screening:  Hepatitis C Screening: does qualify; Completed 05/17/2018  Vision Screening: Recommended annual ophthalmology exams for early detection of glaucoma and other disorders of the eye. Is the patient up to date with their annual eye exam?  Yes  Who is the provider or what is the name of the office in which the patient attends annual eye exams? [facility], [place]. If pt is not established with a provider, would they like to be referred to a provider to establish care? No .   Dental Screening: Recommended annual dental exams for proper oral hygiene  Diabetic Foot Exam: n/a  Community Resource Referral / Chronic Care Management: CRR required this visit?  No   CCM required this visit?  No     Plan:     I have personally reviewed and noted the following in the patient's chart:   Medical and social history Use of alcohol, tobacco or illicit drugs  Current medications and supplements including opioid prescriptions. Patient is not currently taking opioid prescriptions. Functional ability and status Nutritional status Physical  activity Advanced directives List of other physicians Hospitalizations, surgeries, and ER visits in previous 12 months Vitals Screenings to include cognitive, depression, and falls Referrals and appointments  In addition, I have reviewed and discussed with patient certain preventive protocols, quality metrics, and best practice recommendations. A written personalized care plan for preventive services as well as general preventive health recommendations were provided to patient.     Mickeal Needy, LPN   1/61/0960   After Visit Summary: (Mail) Due to this being a telephonic visit, the after visit summary with patients personalized plan was offered to patient via mail   Nurse Notes: Normal cognitive status assessed by direct observation via telephone conversation by this Nurse Health Advisor. No abnormalities found.

## 2022-11-25 NOTE — Patient Instructions (Addendum)
Christine Pitts , Thank you for taking time to come for your Medicare Wellness Visit. I appreciate your ongoing commitment to your health goals. Please review the following plan we discussed and let me know if I can assist you in the future.   Referrals/Orders/Follow-Ups/Clinician Recommendations: Yes  This is a list of the screening recommended for you and due dates:  Health Maintenance  Topic Date Due   COVID-19 Vaccine (6 - 2023-24 season) 05/02/2022   Flu Shot  10/30/2022   Medicare Annual Wellness Visit  11/25/2023   Mammogram  01/10/2024   DTaP/Tdap/Td vaccine (2 - Td or Tdap) 02/11/2028   Colon Cancer Screening  12/23/2028   Pneumonia Vaccine  Completed   DEXA scan (bone density measurement)  Completed   Hepatitis C Screening  Completed   Zoster (Shingles) Vaccine  Completed   HPV Vaccine  Aged Out    Advanced directives: (Declined) Advance directive discussed with you today. Even though you declined this today, please call our office should you change your mind, and we can give you the proper paperwork for you to fill out.  Next Medicare Annual Wellness Visit scheduled for next year: Yes  Preventive Care attachment FALL PREVENTION attachment

## 2022-12-19 ENCOUNTER — Other Ambulatory Visit: Payer: Self-pay | Admitting: Internal Medicine

## 2022-12-19 DIAGNOSIS — Z1231 Encounter for screening mammogram for malignant neoplasm of breast: Secondary | ICD-10-CM

## 2023-01-13 ENCOUNTER — Ambulatory Visit
Admission: RE | Admit: 2023-01-13 | Discharge: 2023-01-13 | Disposition: A | Discharge status: Home / Self Care | Payer: Medicare Other | Source: Ambulatory Visit | Attending: Internal Medicine | Admitting: Internal Medicine

## 2023-01-13 DIAGNOSIS — Z1231 Encounter for screening mammogram for malignant neoplasm of breast: Secondary | ICD-10-CM

## 2023-05-25 ENCOUNTER — Telehealth: Payer: Self-pay | Admitting: Internal Medicine

## 2023-05-25 NOTE — Telephone Encounter (Signed)
Ok done hardcopy to cma 

## 2023-05-25 NOTE — Telephone Encounter (Unsigned)
 Copied from CRM [id number]. Topic: Clinical - Medical Advice >> May 25, 2023 11:01 AM Christine Pitts wrote: Reason for CRM: Patient has to walk far when going to church and is asking for a handicapped parking pass. Patient still has a lot of pain with legs without holding to assist with a cane, had surgery about 2 years ago but it still has not fully healed. Please advise and call back at [phone].

## 2023-05-27 NOTE — Telephone Encounter (Signed)
 Called and let Pt know it is ready for pick up and will be placed up front.

## 2023-06-11 ENCOUNTER — Encounter: Payer: Self-pay | Admitting: Internal Medicine

## 2023-06-11 ENCOUNTER — Ambulatory Visit: Payer: Medicare Other | Admitting: Internal Medicine

## 2023-06-11 VITALS — BP 122/78 | HR 79 | Temp 97.9°F | Ht 65.5 in | Wt 158.0 lb

## 2023-06-11 DIAGNOSIS — Z0000 Encounter for general adult medical examination without abnormal findings: Secondary | ICD-10-CM | POA: Diagnosis not present

## 2023-06-11 DIAGNOSIS — E559 Vitamin D deficiency, unspecified: Secondary | ICD-10-CM

## 2023-06-11 DIAGNOSIS — M1711 Unilateral primary osteoarthritis, right knee: Secondary | ICD-10-CM | POA: Diagnosis not present

## 2023-06-11 DIAGNOSIS — M1712 Unilateral primary osteoarthritis, left knee: Secondary | ICD-10-CM

## 2023-06-11 DIAGNOSIS — E538 Deficiency of other specified B group vitamins: Secondary | ICD-10-CM

## 2023-06-11 DIAGNOSIS — R739 Hyperglycemia, unspecified: Secondary | ICD-10-CM

## 2023-06-11 DIAGNOSIS — I10 Essential (primary) hypertension: Secondary | ICD-10-CM | POA: Diagnosis not present

## 2023-06-11 DIAGNOSIS — Z0001 Encounter for general adult medical examination with abnormal findings: Secondary | ICD-10-CM

## 2023-06-11 DIAGNOSIS — E7800 Pure hypercholesterolemia, unspecified: Secondary | ICD-10-CM | POA: Diagnosis not present

## 2023-06-11 LAB — URINALYSIS, ROUTINE W REFLEX MICROSCOPIC
Bilirubin Urine: NEGATIVE
Hgb urine dipstick: NEGATIVE
Ketones, ur: NEGATIVE
Leukocytes,Ua: NEGATIVE
Nitrite: NEGATIVE
RBC / HPF: NONE SEEN (ref 0–?)
Specific Gravity, Urine: >=1.03 — AB (ref 1.000–1.030)
Total Protein, Urine: NEGATIVE
Urine Glucose: NEGATIVE
Urobilinogen, UA: 0.2 (ref 0.0–1.0)
pH: 5.5 (ref 5.0–8.0)

## 2023-06-11 LAB — BASIC METABOLIC PANEL
BUN: 26 mg/dL — ABNORMAL HIGH (ref 6–23)
CO2: 28 meq/L (ref 19–32)
Calcium: 9.4 mg/dL (ref 8.4–10.5)
Chloride: 105 meq/L (ref 96–112)
Creatinine, Ser: 0.91 mg/dL (ref 0.40–1.20)
GFR: 63.06 mL/min (ref >60.00)
Glucose, Bld: 98 mg/dL (ref 70–99)
Potassium: 4.4 meq/L (ref 3.5–5.1)
Sodium: 142 meq/L (ref 135–145)

## 2023-06-11 LAB — CBC WITH DIFFERENTIAL/PLATELET
Basophils Absolute: 0 10*3/uL (ref 0.0–0.1)
Basophils Relative: 1.4 % (ref 0.0–3.0)
Eosinophils Absolute: 0.2 10*3/uL (ref 0.0–0.7)
Eosinophils Relative: 5.1 % — ABNORMAL HIGH (ref 0.0–5.0)
HCT: 37 % (ref 36.0–46.0)
Hemoglobin: 12.4 g/dL (ref 12.0–15.0)
Lymphocytes Relative: 27.7 % (ref 12.0–46.0)
Lymphs Abs: 0.9 10*3/uL (ref 0.7–4.0)
MCHC: 33.5 g/dL (ref 30.0–36.0)
MCV: 100.6 fl — ABNORMAL HIGH (ref 78.0–100.0)
Monocytes Absolute: 0.2 10*3/uL (ref 0.1–1.0)
Monocytes Relative: 5.7 % (ref 3.0–12.0)
Neutro Abs: 1.9 10*3/uL (ref 1.4–7.7)
Neutrophils Relative %: 60.1 % (ref 43.0–77.0)
Platelets: 316 10*3/uL (ref 150.0–400.0)
RBC: 3.68 Mil/uL — ABNORMAL LOW (ref 3.87–5.11)
RDW: 13.6 % (ref 11.5–15.5)
WBC: 3.2 10*3/uL — ABNORMAL LOW (ref 4.0–10.5)

## 2023-06-11 LAB — TSH: TSH: 1.31 u[IU]/mL (ref 0.35–5.50)

## 2023-06-11 LAB — HEMOGLOBIN A1C: Hgb A1c MFr Bld: 5.6 % (ref 4.6–6.5)

## 2023-06-11 LAB — VITAMIN D 25 HYDROXY (VIT D DEFICIENCY, FRACTURES): VITD: 29.66 ng/mL — ABNORMAL LOW (ref 30.00–100.00)

## 2023-06-11 LAB — MICROALBUMIN / CREATININE URINE RATIO
Creatinine,U: 157.5 mg/dL
Microalb Creat Ratio: 28.2 mg/g (ref 0.0–30.0)
Microalb, Ur: 4.4 mg/dL — ABNORMAL HIGH (ref 0.0–1.9)

## 2023-06-11 LAB — LIPID PANEL
Cholesterol: 177 mg/dL (ref 0–200)
HDL: 89.6 mg/dL (ref >39.00)
LDL Cholesterol: 79 mg/dL (ref 0–99)
NonHDL: 86.95
Total CHOL/HDL Ratio: 2
Triglycerides: 40 mg/dL (ref 0.0–149.0)
VLDL: 8 mg/dL (ref 0.0–40.0)

## 2023-06-11 LAB — HEPATIC FUNCTION PANEL
ALT: 12 U/L (ref 0–35)
AST: 18 U/L (ref 0–37)
Albumin: 4.6 g/dL (ref 3.5–5.2)
Alkaline Phosphatase: 82 U/L (ref 39–117)
Bilirubin, Direct: 0.1 mg/dL (ref 0.0–0.3)
Total Bilirubin: 0.4 mg/dL (ref 0.2–1.2)
Total Protein: 7.3 g/dL (ref 6.0–8.3)

## 2023-06-11 LAB — VITAMIN B12: Vitamin B-12: 361 pg/mL (ref 211–911)

## 2023-06-11 MED ORDER — CELECOXIB 200 MG PO CAPS: 200.0000 mg | ORAL_CAPSULE | Freq: Two times a day (BID) | ORAL | 1 refills | Status: AC

## 2023-06-11 MED ORDER — FUROSEMIDE 20 MG PO TABS: ORAL_TABLET | ORAL | 3 refills | Status: DC

## 2023-06-11 MED ORDER — VALSARTAN 80 MG PO TABS: ORAL_TABLET | ORAL | 3 refills | Status: DC

## 2023-06-11 MED ORDER — ATORVASTATIN CALCIUM 40 MG PO TABS: 40.0000 mg | ORAL_TABLET | Freq: Every day | ORAL | 3 refills | Status: DC

## 2023-06-11 NOTE — Progress Notes (Signed)
 Patient ID: Christine Pitts, female   DOB: [birth year], 73 y.o.   MRN: [id number]         Chief Complaint:: wellness exam and low vit d, , htn, hyperglycemia, hld       HPI:  Christine Pitts is a 73 y.o. female here for wellness exam; declines covid booster o/w up to date                        Also still grieving over husband passing a few yrs ago but has a support group at church,  has had mild worsening depressive symptoms, suicidal ideation, or panic, but declines need for pharm tx.  Still working 20 hrs per wk, and goes to gym 6 days per wk, Pt denies chest pain, increased sob or doe, wheezing, orthopnea, PND, increased LE swelling, palpitations, dizziness or syncope.   Pt denies polydipsia, polyuria, or new focal neuro s/s.    Pt denies fever, wt loss, night sweats, loss of appetite, or other constitutional symptoms    Wt Readings from Last 3 Encounters:  06/11/23 158 lb (71.7 kg)  11/25/22 159 lb (72.1 kg)  06/10/22 159 lb (72.1 kg)   BP Readings from Last 3 Encounters:  06/11/23 122/78  06/10/22 122/78  06/05/21 118/68   Immunization History  Administered Date(s) Administered   Fluad Quad(high Dose 65+) 01/22/2023   Influenza, High Dose Seasonal PF 12/30/2017, 11/15/2018, 01/17/2022   Influenza-Unspecified 11/15/2018, 01/30/2020, 02/18/2021   PFIZER(Purple Top)SARS-COV-2 Vaccination 05/24/2019, 06/14/2019, 01/16/2020, 12/31/2020   Pfizer Covid-19 Vaccine Bivalent Booster 16yrs & up 01/22/2023   Pfizer(Comirnaty)Fall Seasonal Vaccine 12 years and older 12/30/2021   Pneumococcal Conjugate-13 05/17/2018   Pneumococcal Polysaccharide-23 05/31/2020   Pneumococcal-Unspecified 03/04/2021   Tdap 02/10/2018   Zoster Recombinant(Shingrix) 03/04/2021, 04/01/2021, 05/09/2021   Health Maintenance Due  Topic Date Due   COVID-19 Vaccine (7 - 2024-25 season) 03/19/2023      Past Medical History:  Diagnosis Date   Anemia    past hx of anemia   Arthritis    Chicken pox    HLD  (hyperlipidemia) 11/17/2018   Hypertension    Past Surgical History:  Procedure Laterality Date   CARPAL TUNNEL RELEASE Left 06/22/2017   Procedure: LEFT CARPAL TUNNEL RELEASE;  Surgeon: Betha Loa, MD;  Location: [facility];  Service: Orthopedics;  Laterality: Left;   COLONOSCOPY     10 + yrs ago was normal per pt    TONSILLECTOMY  1962   TUBAL LIGATION  1982    reports that she has never smoked. She has never used smokeless tobacco. She reports that she does not drink alcohol and does not use drugs. family history includes Arthritis in her mother; Heart attack in her mother; Hypertension in her father; Stroke in her mother. No Known Allergies Current Outpatient Medications on File Prior to Visit  Medication Sig Dispense Refill   aspirin 81 MG EC tablet 1 tab by mouth once daily 30 tablet 99   bisacodyl (DULCOLAX) 5 MG EC tablet Take 5 mg by mouth once. For golytely prep x 4 for colon 9-25     Cholecalciferol (THERA-D 2000) 50 MCG (2000 UT) TABS 1 tab by mouth once daily 30 tablet 99   No current facility-administered medications on file prior to visit.        ROS:  All others reviewed and negative.  Objective        PE:  BP 122/78 (BP Location: Right  Arm, Patient Position: Sitting, Cuff Size: Normal)   Pulse 79   Temp 97.9 F (36.6 C) (Oral)   Ht 5' 5.5" (1.664 m)   Wt 158 lb (71.7 kg)   SpO2 100%   BMI 25.89 kg/m                 Constitutional: Pt appears in NAD               HENT: Head: NCAT.                Right Ear: External ear normal.                 Left Ear: External ear normal.                Eyes: . Pupils are equal, round, and reactive to light. Conjunctivae and EOM are normal               Nose: without d/c or deformity               Neck: Neck supple. Gross normal ROM               Cardiovascular: Normal rate and regular rhythm.                 Pulmonary/Chest: Effort normal and breath sounds without rales or wheezing.                Abd:   Soft, NT, ND, + BS, no organomegaly               Neurological: Pt is alert. At baseline orientation, motor grossly intact               Skin: Skin is warm. No rashes, no other new lesions, LE edema - none               Psychiatric: Pt behavior is normal without agitation   Micro: none  Cardiac tracings I have personally interpreted today:  none  Pertinent Radiological findings (summarize): none   Lab Results  Component Value Date   WBC 3.2 (L) 06/11/2023   HGB 12.4 06/11/2023   HCT 37.0 06/11/2023   PLT 316.0 06/11/2023   GLUCOSE 98 06/11/2023   CHOL 177 06/11/2023   TRIG 40.0 06/11/2023   HDL 89.60 06/11/2023   LDLCALC 79 06/11/2023   ALT 12 06/11/2023   AST 18 06/11/2023   NA 142 06/11/2023   K 4.4 06/11/2023   CL 105 06/11/2023   CREATININE 0.91 06/11/2023   BUN 26 (H) 06/11/2023   CO2 28 06/11/2023   TSH 1.31 06/11/2023   HGBA1C 5.6 06/11/2023   MICROALBUR 4.4 (H) 06/11/2023   Assessment/Plan:  Christine Pitts is a 73 y.o. Black or African American [2] female with  has a past medical history of Anemia, Arthritis, Chicken pox, HLD (hyperlipidemia) (11/17/2018), and Hypertension.  Vitamin D deficiency Last vitamin D Lab Results  Component Value Date   VD25OH 25.13 (L) 06/10/2022   Low, to start oral replacement   Encounter for well adult exam with abnormal findings Age and sex appropriate education and counseling updated with regular exercise and diet Referrals for preventative services - none needed Immunizations addressed - declines covid booster Smoking counseling  - none needed Evidence for depression or other mood disorder - none significant Most recent labs reviewed. I have personally reviewed and have noted: 1) the patient's medical and social history 2) The patient's current medications  and supplements 3) The patient's height, weight, and BMI have been recorded in the chart   Hyperglycemia Lab Results  Component Value Date   HGBA1C 5.6 06/11/2023    Stable, pt to continue current medical treatment  - diet, wt control   Hyperlipidemia Lab Results  Component Value Date   LDLCALC 79 06/11/2023   Stable, pt to continue current statin lipitor 40 mg qd   Hypertensive disorder BP Readings from Last 3 Encounters:  06/11/23 122/78  06/10/22 122/78  06/05/21 118/68   Stable, pt to continue medical treatment diovan 80 qd   Primary osteoarthritis of left knee Mild to mod, for voltaren gel prn,  to f/u any worsening symptoms or concerns  Primary osteoarthritis of right knee Also for volt gel prn  Followup: Return in about 1 year (around 06/10/2024).  Oliver Barre, MD 06/13/2023 11:49 PM [facility] Internal [facility]

## 2023-06-11 NOTE — Assessment & Plan Note (Signed)
 Last vitamin D Lab Results  Component Value Date   VD25OH 25.13 (L) 06/10/2022   Low, to start oral replacement

## 2023-06-11 NOTE — Patient Instructions (Signed)

## 2023-06-13 ENCOUNTER — Encounter: Payer: Self-pay | Admitting: Internal Medicine

## 2023-06-13 NOTE — Assessment & Plan Note (Signed)
Mild to mod, for voltaren gel prn,  to f/u any worsening symptoms or concerns  

## 2023-06-13 NOTE — Assessment & Plan Note (Signed)
 Also for volt gel prn

## 2023-06-13 NOTE — Assessment & Plan Note (Signed)
 Lab Results  Component Value Date   LDLCALC 79 06/11/2023   Stable, pt to continue current statin lipitor 40 mg qd

## 2023-06-13 NOTE — Assessment & Plan Note (Signed)
 BP Readings from Last 3 Encounters:  06/11/23 122/78  06/10/22 122/78  06/05/21 118/68   Stable, pt to continue medical treatment diovan 80 qd

## 2023-06-13 NOTE — Assessment & Plan Note (Signed)
 Lab Results  Component Value Date   HGBA1C 5.6 06/11/2023   Stable, pt to continue current medical treatment  - diet, wt control

## 2023-06-13 NOTE — Assessment & Plan Note (Signed)

## 2023-07-14 ENCOUNTER — Ambulatory Visit
Admission: RE | Admit: 2023-07-14 | Discharge: 2023-07-14 | Disposition: A | Discharge status: Home / Self Care | Payer: Medicare Other | Source: Ambulatory Visit | Attending: Internal Medicine | Admitting: Internal Medicine

## 2023-07-14 DIAGNOSIS — M810 Age-related osteoporosis without current pathological fracture: Secondary | ICD-10-CM | POA: Diagnosis not present

## 2023-07-14 DIAGNOSIS — Z13820 Encounter for screening for osteoporosis: Secondary | ICD-10-CM

## 2023-07-15 ENCOUNTER — Encounter: Payer: Self-pay | Admitting: Internal Medicine

## 2023-08-25 ENCOUNTER — Other Ambulatory Visit: Payer: Self-pay | Admitting: Internal Medicine

## 2023-08-25 ENCOUNTER — Other Ambulatory Visit: Payer: Self-pay

## 2023-08-25 DIAGNOSIS — I10 Essential (primary) hypertension: Secondary | ICD-10-CM

## 2023-09-01 ENCOUNTER — Ambulatory Visit: Payer: Self-pay

## 2023-09-01 NOTE — Telephone Encounter (Signed)
 Copied from CRM [id number]. Topic: Clinical - Red Word Triage >> Sep 01, 2023 11:41 AM Elle L wrote: Red Word that prompted transfer to Nurse Triage: The patient states she is having ear pain and she has a sore throat.  Chief Complaint: Sore throat Symptoms: Earache, congestion Frequency: About a week Pertinent Negatives: Patient denies fever Disposition: [] ED /[] Urgent Care (no appt availability in office) / [x] Appointment(In office/virtual)/ []  [facility]/ [] Home Care/ [] Refused Recommended Disposition /[]  Mobile Bus/ []  Follow-up with PCP Additional Notes: Patient called in to report a sore throat that started this week. Patient stated she is also experiencing a left earache that started last week. Patient denied difficulty swallowing and fever. Advised patient to be seen within 24 hours, per protocol. Patient requested early morning appointment. Scheduled patient with an alternate provider in office tomorrow morning. Provided care advice and instructed patient to call back if symptoms worsen. Patient complied.   Reason for Disposition  Earache also present  Answer Assessment - Initial Assessment Questions 1. ONSET: "When did the throat start hurting?" (Hours or days ago)      This week  2. SEVERITY: "How bad is the sore throat?" (Scale 1-10; mild, moderate or severe)   - MILD (1-3):  Doesn't interfere with eating or normal activities.   - MODERATE (4-7): Interferes with eating some solids and normal activities.   - SEVERE (8-10):  Excruciating pain, interferes with most normal activities.   - SEVERE WITH DYSPHAGIA (10): Can't swallow liquids, drooling.     Rates pain a 3- states she is still able to swallow normally 4.  VIRAL SYMPTOMS: "Are there any symptoms of a cold, such as a runny nose, cough, hoarse voice or red eyes?"      Swollen lymph nodes, "congestion comes up from throat sometimes" 5. FEVER: "Do you have a fever?" If Yes, ask: "What is your temperature,  how was it measured, and when did it start?"     Denies 6. PUS ON THE TONSILS: "Is there pus on the tonsils in the back of your throat?"     Has not checked 7. OTHER SYMPTOMS: "Do you have any other symptoms?" (e.g., difficulty breathing, headache, rash)     Left ear pain since last week Has been gargling salt water  Protocols used: Sore Throat-A-AH

## 2023-09-02 ENCOUNTER — Ambulatory Visit (INDEPENDENT_AMBULATORY_CARE_PROVIDER_SITE_OTHER): Admitting: Internal Medicine

## 2023-09-02 ENCOUNTER — Encounter: Payer: Self-pay | Admitting: Internal Medicine

## 2023-09-02 VITALS — BP 132/86 | HR 76 | Temp 98.0°F | Ht 65.5 in | Wt 161.0 lb

## 2023-09-02 DIAGNOSIS — H6122 Impacted cerumen, left ear: Secondary | ICD-10-CM | POA: Diagnosis not present

## 2023-09-02 DIAGNOSIS — J069 Acute upper respiratory infection, unspecified: Secondary | ICD-10-CM

## 2023-09-02 DIAGNOSIS — I10 Essential (primary) hypertension: Secondary | ICD-10-CM

## 2023-09-02 NOTE — Patient Instructions (Addendum)
    Your left ear was cleaned out today    Medications changes include :   continue nasal spray, start claritin   Drink a lot of fluid   Return if symptoms worsen or fail to improve.

## 2023-09-02 NOTE — Progress Notes (Signed)
 Subjective:    Patient ID: Christine Pitts, female    DOB: 1950-06-27, 73 y.o.   MRN: [id number]      HPI Christine Pitts is here for  Chief Complaint  Patient presents with   Sore Throat    Left ear pain and sore throat since the weekend    2-3 days ago Christine Pitts stared having a swelling in her left neck, throat irritation and left ear ache.  Christine Pitts felt pressure in the left ear.   It got better but then went back out in to the yard and her symptoms got worse again.   Has tried warm water gargles  Does not have seasonal allergies.    Medications and allergies reviewed with patient and updated if appropriate.  Current Outpatient Medications on File Prior to Visit  Medication Sig Dispense Refill   aspirin  81 MG EC tablet 1 tab by mouth once daily 30 tablet 99   atorvastatin  (LIPITOR) 40 MG tablet TAKE 1 TABLET(40 MG) BY MOUTH DAILY 90 tablet 3   bisacodyl (DULCOLAX) 5 MG EC tablet Take 5 mg by mouth once. For golytely prep x 4 for colon 9-25     celecoxib  (CELEBREX ) 200 MG capsule Take 1 capsule (200 mg total) by mouth 2 (two) times daily. 180 capsule 1   Cholecalciferol (THERA-D 2000) 50 MCG (2000 UT) TABS 1 tab by mouth once daily 30 tablet 99   furosemide  (LASIX ) 20 MG tablet TAKE 1 TABLET(20 MG) BY MOUTH DAILY AS NEEDED 90 tablet 3   magic mouthwash SOLN Take 5 mLs by mouth. Suspension contains equal amounts of Maalox Extra Strength, nystatin, and diphenhydramine.     valsartan  (DIOVAN ) 80 MG tablet TAKE 1 TABLET(80 MG) BY MOUTH DAILY 90 tablet 3   No current facility-administered medications on file prior to visit.    Review of Systems  Constitutional:  Negative for fever.  HENT:  Positive for congestion (mild), ear pain (ache, discomfort) and postnasal drip. Negative for sinus pressure, sinus pain and sore throat.        Left swollen LN  Respiratory:  Positive for cough (a little from PND). Negative for chest tightness, shortness of breath and wheezing.   Neurological:  Positive for  headaches (mild a couple of times). Negative for dizziness and light-headedness.       Objective:   Vitals:   09/02/23 0757  BP: 132/86  Pulse: 76  Temp: 98 F (36.7 C)  SpO2: 98%   BP Readings from Last 3 Encounters:  09/02/23 132/86  06/11/23 122/78  06/10/22 122/78   Wt Readings from Last 3 Encounters:  09/02/23 161 lb (73 kg)  06/11/23 158 lb (71.7 kg)  11/25/22 159 lb (72.1 kg)   Body mass index is 26.38 kg/m.    Physical Exam Constitutional:      General: Christine Pitts is not in acute distress.    Appearance: Normal appearance. Christine Pitts is not ill-appearing.  HENT:     Head: Normocephalic and atraumatic.     Right Ear: Tympanic membrane, ear canal and external ear normal. There is no impacted cerumen.     Left Ear: External ear normal. There is impacted cerumen.     Ears:     Comments: Left ear canal impacted with cerumen, TM not visible    Mouth/Throat:     Mouth: Mucous membranes are moist.     Pharynx: No oropharyngeal exudate or posterior oropharyngeal erythema.  Eyes:     Conjunctiva/sclera: Conjunctivae normal.  Cardiovascular:  Rate and Rhythm: Normal rate and regular rhythm.  Pulmonary:     Effort: Pulmonary effort is normal. No respiratory distress.     Breath sounds: Normal breath sounds. No wheezing or rales.  Musculoskeletal:     Cervical back: Neck supple. No tenderness.  Lymphadenopathy:     Cervical: No cervical adenopathy.  Skin:    General: Skin is warm and dry.  Neurological:     Mental Status: Christine Pitts is alert.            Assessment & Plan:    URI: Acute Symptoms consistent with probable viral URI No need for an antibiotic Advised to continue nasal spray Start Claritin daily for several days Increase fluids, Tylenol  as needed Advised her to call if symptoms are not improving or worsening  Impacted cerumen left ear: Christine Pitts is having some left ear discomfort and pressure and this is likely the cause Ear lavage performed by CMA-see  procedure note  Hypertension: Chronic Blood pressure controlled, but diastolic borderline No change in medications Continue valsartan  80 mg daily

## 2023-09-02 NOTE — Progress Notes (Signed)
PRE-PROCEDURE EXAM: Left TM cannot be visualized due to total occlusion/impaction of the ear canal.  PROCEDURE INDICATION: remove wax to visualize ear drum & relieve discomfort  CONSENT:  Verbal     PROCEDURE NOTE:     Left  EAR:  I used a metal wax curette under direct vision with an otoscope to free the wax bolus from the ear wall and then successfully removed a small bit of wax. The ear was then irrigated with warm water to remove the remaining wax.  POST- PROCEDURE EXAM: Left TMs successfully visualized and found to have no erythema     The patient tolerated the procedure well.

## 2023-09-02 NOTE — Addendum Note (Signed)
 Addended by: Arch Beans, Ameshia Pewitt P on: 09/02/2023 12:49 PM   Modules accepted: Orders

## 2023-09-07 ENCOUNTER — Ambulatory Visit (INDEPENDENT_AMBULATORY_CARE_PROVIDER_SITE_OTHER): Admitting: Family Medicine

## 2023-09-07 ENCOUNTER — Ambulatory Visit: Payer: Self-pay

## 2023-09-07 VITALS — BP 128/72 | HR 71 | Temp 98.1°F | Ht 65.5 in | Wt 158.2 lb

## 2023-09-07 DIAGNOSIS — R591 Generalized enlarged lymph nodes: Secondary | ICD-10-CM

## 2023-09-07 DIAGNOSIS — H9202 Otalgia, left ear: Secondary | ICD-10-CM | POA: Diagnosis not present

## 2023-09-07 MED ORDER — AMOXICILLIN-POT CLAVULANATE 875-125 MG PO TABS: 1.0000 | ORAL_TABLET | Freq: Two times a day (BID) | ORAL | 0 refills | Status: AC

## 2023-09-07 NOTE — Patient Instructions (Signed)
 I have sent in Augmentin for you to take twice a day for 7 days.  This medication can upset your stomach, so I tell everyone to take it with a meal.  Let me know if things are not improving by Thursday.

## 2023-09-07 NOTE — Telephone Encounter (Signed)
 FYI Only or Action Required?: FYI only for provider  Patient was last seen in primary care on 09/02/2023 by Colene Dauphin, MD. Called Nurse Triage reporting Otalgia. Symptoms began several days ago. Interventions attempted: OTC medications: Claritin and OTC earache drops. Symptoms are: gradually worsening.  Triage Disposition: See Physician Within 24 Hours  Patient/caregiver understands and will follow disposition?: Yes                             Copied from CRM [id number]. Topic: Clinical - Red Word Triage >> Sep 07, 2023 10:32 AM Oddis Bench wrote: Red Word that prompted transfer to Nurse Triage: Patient is calling about ear ache she had ear cleaned out at the office and it is getting bad it wakes her up at night aching. Reason for Disposition  Earache  (Exceptions: brief ear pain of < 60 minutes duration, earache occurring during air travel  Answer Assessment - Initial Assessment Questions 1. LOCATION: "Which ear is involved?"     Left ear 2. ONSET: "When did the ear start hurting"      When to office on 09/02/23 and had ear wax cleaned out, experienced initial relief and stated ear started aching again the next day 3. SEVERITY: "How bad is the pain?"  (Scale 1-10; mild, moderate or severe)   - MILD (1-3): doesn't interfere with normal activities    - MODERATE (4-7): interferes with normal activities or awakens from sleep    - SEVERE (8-10): excruciating pain, unable to do any normal activities      Rates pain a 5 at this time, states pain woke her up last night 4. URI SYMPTOMS: "Do you have a runny nose or cough?"     Sore throat, states she feels "stopped up", denies cough 5. FEVER: "Do you have a fever?" If Yes, ask: "What is your temperature, how was it measured, and when did it start?"     Denies 6. CAUSE: "Have you been swimming recently?", "How often do you use Q-TIPS?", "Have you had any recent air travel or scuba diving?"     Initially thought to be ear wax  build-up 7. OTHER SYMPTOMS: "Do you have any other symptoms?" (e.g., headache, stiff neck, dizziness, vomiting, runny nose, decreased hearing)     Left ear feels warm to the touch  Denies discharge, denies headache, denies dizziness  Protocols used: Earache-A-AH

## 2023-09-07 NOTE — Progress Notes (Unsigned)
 Acute Office Visit  Subjective:     Patient ID: Christine Pitts, female    DOB: 1950/05/12, 73 y.o.   MRN: [id number]  Chief Complaint  Patient presents with   Ear Pain    Ear pian ( sharp shooting pain), throat pain since then    HPI Patient is in today for left ear pain and postauricular lymphadenopathy for the last week. Has been taking claritin and using nasal spray. Has been using ear drops with some relief.  Reports that as soon as the eardrops were off, the pain is back. Reports fatigue and chills for the last couple of days. Denies known sick contacts. Denies abdominal pain, nausea, vomiting, diarrhea, rash, fever, other symptoms.   ROS Per HPI      Objective:    BP 128/72   Pulse 71   Temp 98.1 F (36.7 C) (Temporal)   Ht 5' 5.5" (1.664 m)   Wt 158 lb 4 oz (71.8 kg)   SpO2 98%   BMI 25.93 kg/m    Physical Exam Vitals and nursing note reviewed.  Constitutional:      General: She is not in acute distress.    Comments: Appears fatigued  HENT:     Head: Normocephalic and atraumatic.     Right Ear: External ear normal.     Left Ear: External ear normal.     Nose: Congestion and rhinorrhea present.     Mouth/Throat:     Mouth: Mucous membranes are moist.     Pharynx: Oropharynx is clear.     Comments: Oropharyngeal cobblestoning   Eyes:     Extraocular Movements: Extraocular movements intact.     Pupils: Pupils are equal, round, and reactive to light.  Neck:     Vascular: No carotid bruit.  Cardiovascular:     Rate and Rhythm: Normal rate and regular rhythm.     Pulses: Normal pulses.     Heart sounds: Normal heart sounds.  Pulmonary:     Effort: Pulmonary effort is normal. No respiratory distress.     Breath sounds: Normal breath sounds. No wheezing, rhonchi or rales.  Musculoskeletal:        General: Normal range of motion.     Cervical back: Normal range of motion.     Right lower leg: No edema.     Left lower leg: No edema.   Lymphadenopathy:     Head:     Right side of head: No submental, submandibular, tonsillar, preauricular, posterior auricular or occipital adenopathy.     Left side of head: Submandibular and posterior auricular adenopathy present. No submental, tonsillar, preauricular or occipital adenopathy.     Cervical: No cervical adenopathy.     Comments: Left postauricular and submandibular lymph nodes swollen, tender, firm, mobile, warm to touch.  No erythema, no bruising, no lesions noted  Neurological:     General: No focal deficit present.     Mental Status: She is alert and oriented to person, place, and time.  Psychiatric:        Mood and Affect: Mood normal.        Thought Content: Thought content normal.     No results found for any visits on 09/07/23.      Assessment & Plan:   Lymphadenopathy -     Amoxicillin-Pot Clavulanate; Take 1 tablet by mouth 2 (two) times daily for 7 days.  Dispense: 14 tablet; Refill: 0  Left ear pain -     Amoxicillin-Pot  Clavulanate; Take 1 tablet by mouth 2 (two) times daily for 7 days.  Dispense: 14 tablet; Refill: 0  Tx with Augmentin, concern for developing mastoiditis   Meds ordered this encounter  Medications   amoxicillin-clavulanate (AUGMENTIN) 875-125 MG tablet    Sig: Take 1 tablet by mouth 2 (two) times daily for 7 days.    Dispense:  14 tablet    Refill:  0    Return if symptoms worsen or fail to improve.  Wellington Half, FNP

## 2023-09-08 ENCOUNTER — Encounter: Payer: Self-pay | Admitting: Family Medicine

## 2023-09-22 IMAGING — MG MM DIGITAL SCREENING BILAT W/ TOMO AND CAD
8 series · 9 of 24 positions shown · non-contrast
Comparison: Previous exam(s).

CLINICAL DATA: Screening.

EXAM:
DIGITAL SCREENING BILATERAL MAMMOGRAM WITH TOMOSYNTHESIS AND CAD
TECHNIQUE: Bilateral screening digital craniocaudal and mediolateral oblique
mammograms were obtained. Bilateral screening digital breast
tomosynthesis was performed. The images were evaluated with
computer-aided detection.

[R MLO synth-2D]
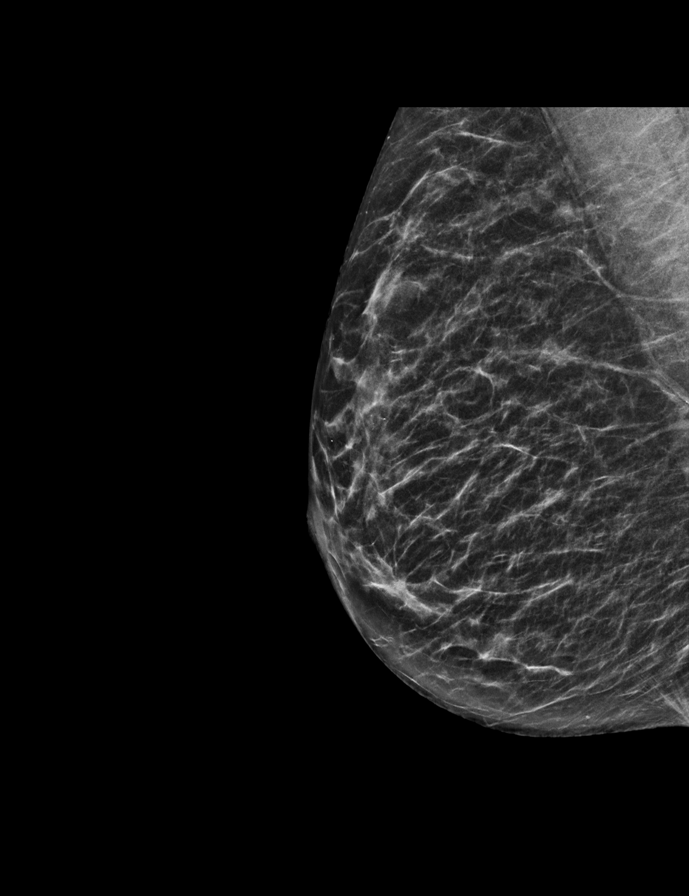

[R CC synth-2D]
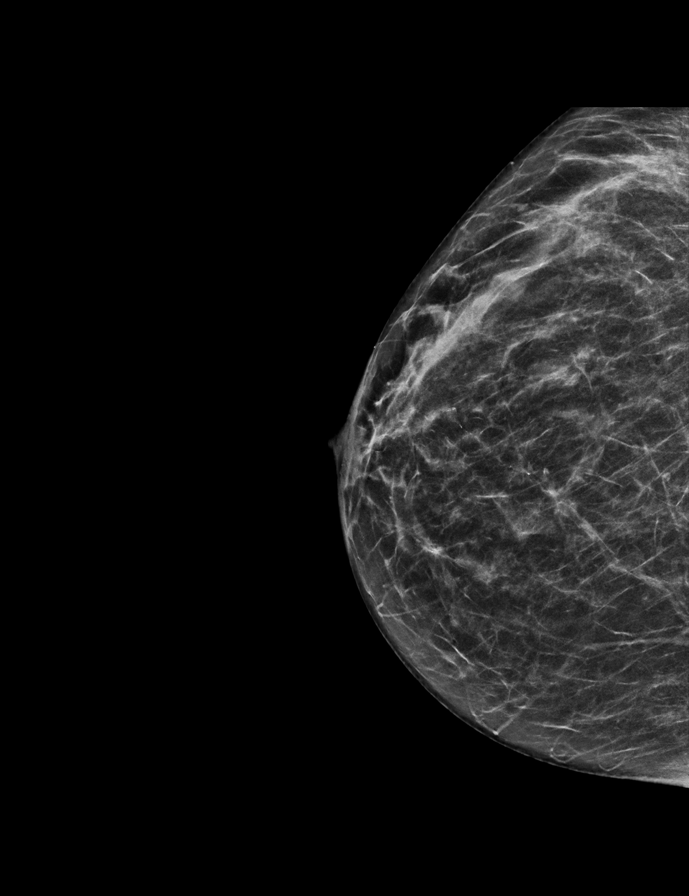

[L CC synth-2D]
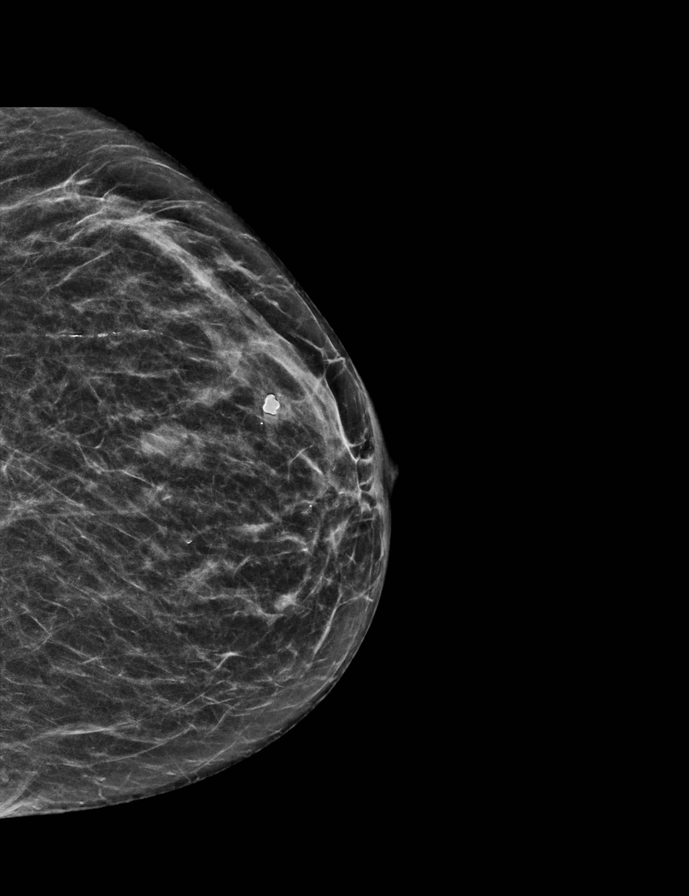

[L MLO synth-2D]
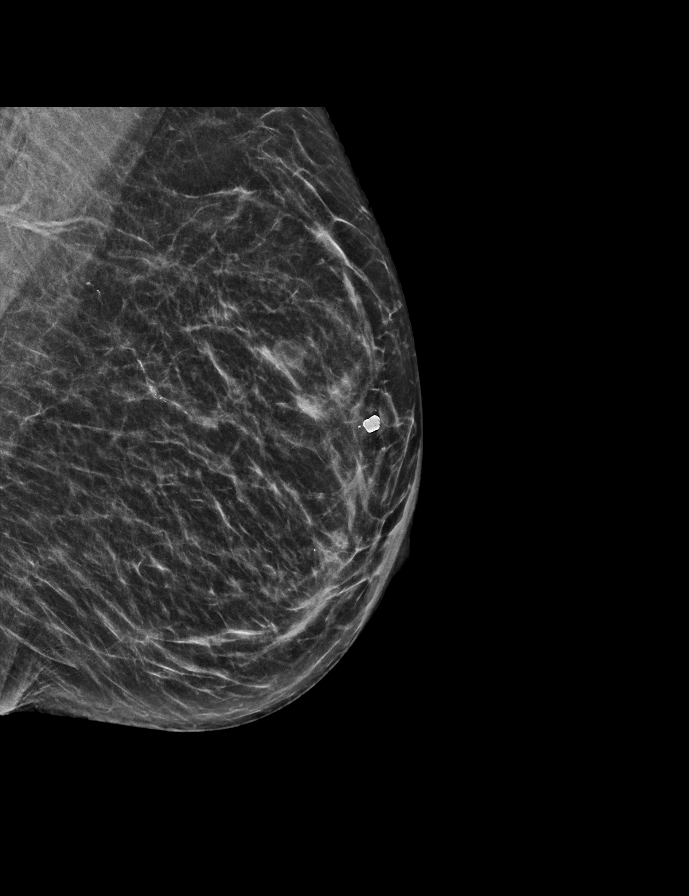

[L MLO tomo · 2 of 51 frames shown]
[frame 17/51]
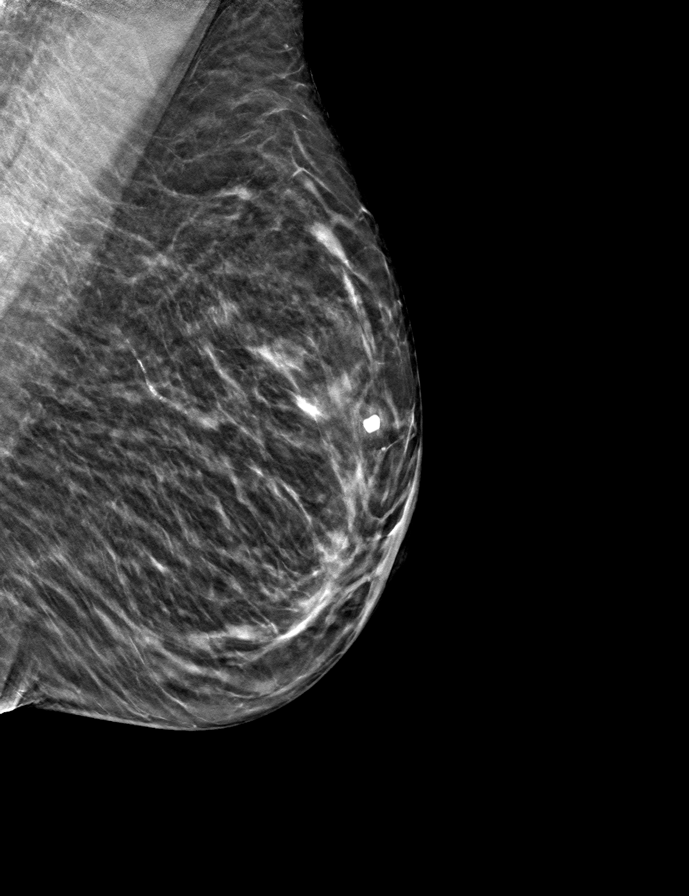
[frame 26/51]
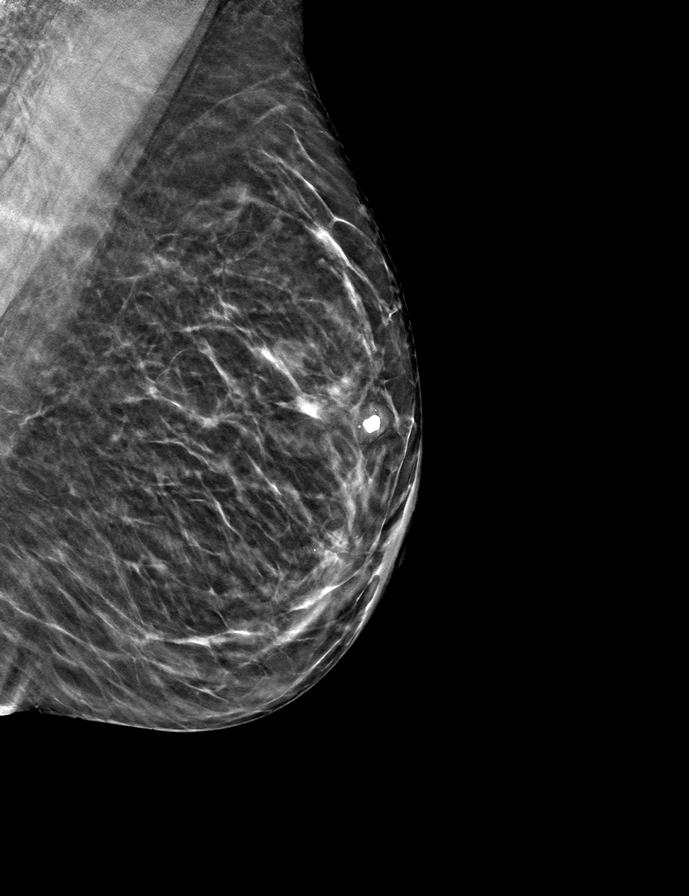

[R CC tomo · tomo slice 25/50.0]
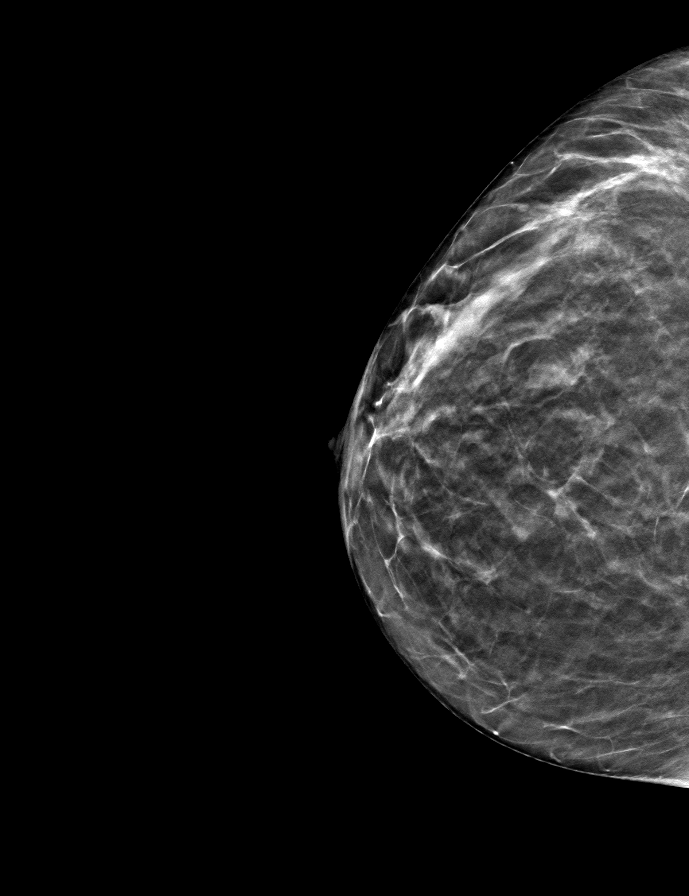

[L CC tomo · tomo slice 25/49.0]
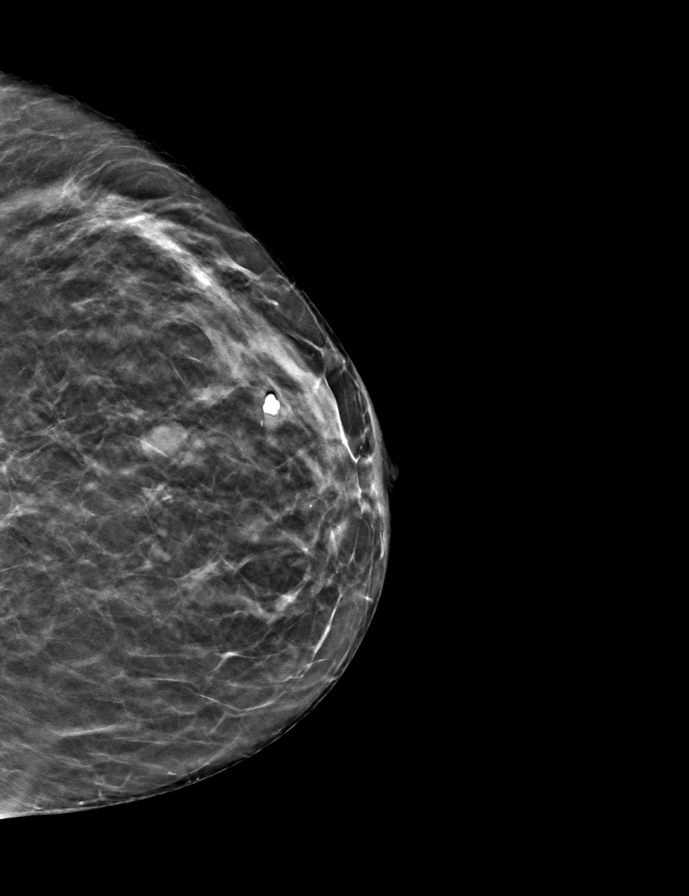

[R MLO tomo · tomo slice 25/49.0]
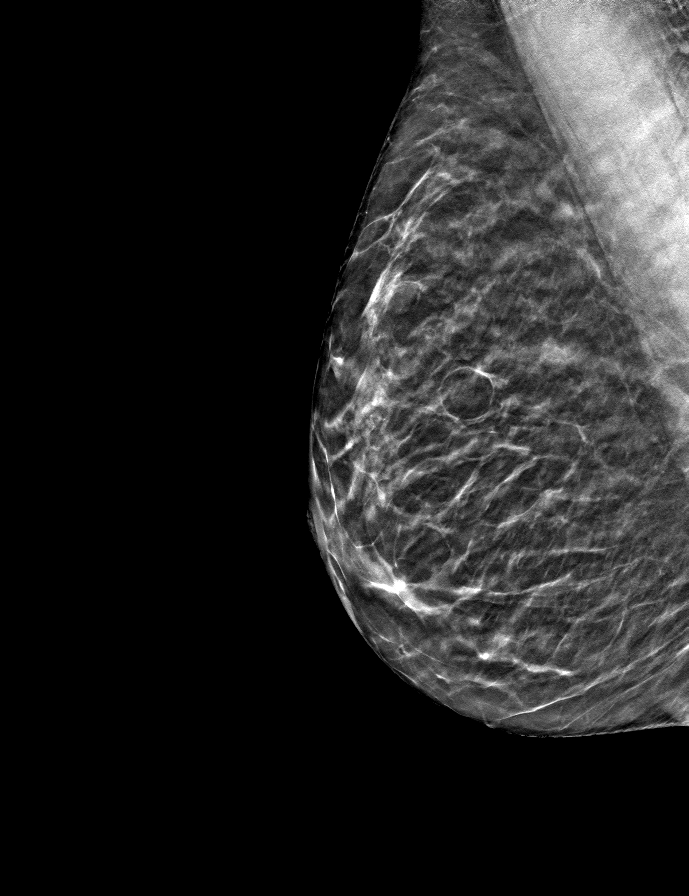

[9 of 24 positions shown; findings below may reference images not displayed]

ACR Breast Density Category b: There are scattered areas of
fibroglandular density.
FINDINGS: There are no findings suspicious for malignancy.
IMPRESSION: No mammographic evidence of malignancy. A result letter of this
screening mammogram will be mailed directly to the patient.

RECOMMENDATION:
Screening mammogram in one year. (Code:51-O-LD2)

BI-RADS CATEGORY  1: Negative.

## 2023-11-26 ENCOUNTER — Ambulatory Visit: Payer: Medicare Other

## 2023-11-26 VITALS — Ht 65.5 in | Wt 158.0 lb

## 2023-11-26 DIAGNOSIS — Z0000 Encounter for general adult medical examination without abnormal findings: Secondary | ICD-10-CM

## 2023-11-26 NOTE — Patient Instructions (Addendum)
 Ms. Christine Pitts , Thank you for taking time out of your busy schedule to complete your Annual Wellness Visit with me. I enjoyed our conversation and look forward to speaking with you again next year. I, as well as your care team,  appreciate your ongoing commitment to your health goals. Please review the following plan we discussed and let me know if I can assist you in the future. Your Game plan/ To Do List    Follow up Visits: We will see or speak with you next year for your Next Medicare AWV with our clinical staff Have you seen your provider in the last 6 months (3 months if uncontrolled diabetes)? Yes.  Last office visit for physical on 06/11/2023.  Please call office to schedule physical for next year at the end of this year.    Clinician Recommendations:  Aim for 30 minutes of exercise or brisk walking, 6-8 glasses of water, and 5 servings of fruits and vegetables each day. Keep up the good work.      This is a list of the screenings recommended for you:  Health Maintenance  Topic Date Due   COVID-19 Vaccine (7 - 2024-25 season) 03/19/2023   Medicare Annual Wellness Visit  11/25/2023   Flu Shot  10/30/2023   Mammogram  01/12/2025   DTaP/Tdap/Td vaccine (2 - Td or Tdap) 02/11/2028   Colon Cancer Screening  12/23/2028   Pneumococcal Vaccine for age over 72  Completed   DEXA scan (bone density measurement)  Completed   Hepatitis C Screening  Completed   Zoster (Shingles) Vaccine  Completed   HPV Vaccine  Aged Out   Meningitis B Vaccine  Aged Out    Advanced directives: (Copy Requested) Please bring a copy of your health care power of attorney and living will to the office to be added to your chart at your convenience. You can mail to [facility] [address] [place], [place] [address] or email to [email or web address] Advance Care Planning is important because it:  [x]  Makes sure you receive the medical care that is consistent with your values, goals, and  preferences  [x]  It provides guidance to your family and loved ones and reduces their decisional burden about whether or not they are making the right decisions based on your wishes.  Follow the link provided in your after visit summary or read over the paperwork we have mailed to you to help you started getting your Advance Directives in place. If you need assistance in completing these, please reach out to us  so that we can help you!  See attachments for Preventive Care and Fall Prevention Tips.

## 2023-11-26 NOTE — Progress Notes (Signed)
 Subjective:   Christine Pitts is a 73 y.o. who presents for a Medicare Wellness preventive visit.  As a reminder, Annual Wellness Visits don't include a physical exam, and some assessments may be limited, especially if this visit is performed virtually. We may recommend an in-person follow-up visit with your provider if needed.  Visit Complete: Virtual I connected with  Rock LELON Abila on 11/26/23 by a audio enabled telemedicine application and verified that I am speaking with the correct person using two identifiers.  Patient Location: Home  Provider Location: Home Office  I discussed the limitations of evaluation and management by telemedicine. The patient expressed understanding and agreed to proceed.  Vital Signs: Because this visit was a virtual/telehealth visit, some criteria may be missing or patient reported. Any vitals not documented were not able to be obtained and vitals that have been documented are patient reported.  VideoDeclined- This patient declined Librarian, academic. Therefore the visit was completed with audio only.  Persons Participating in Visit: Patient.  AWV Questionnaire: No: Patient Medicare AWV questionnaire was not completed prior to this visit.  Cardiac Risk Factors include: advanced age (>38men, >58 women);dyslipidemia     Objective:    Today's Vitals   11/26/23 0848  Weight: 158 lb (71.7 kg)  Height: 5' 5.5 (1.664 m)   Body mass index is 25.89 kg/m.     11/25/2022    9:34 AM 06/22/2017   11:48 AM 06/15/2017   11:42 AM  Advanced Directives  Does Patient Have a Medical Advance Directive? No No  No   Would patient like information on creating a medical advance directive? No - Patient declined Yes (Inpatient - patient requests chaplain consult to create a medical advance directive)       Data saved with a previous flowsheet row definition    Current Medications (verified) Outpatient Encounter Medications as of  11/26/2023  Medication Sig   aspirin  81 MG EC tablet 1 tab by mouth once daily   atorvastatin  (LIPITOR) 40 MG tablet TAKE 1 TABLET(40 MG) BY MOUTH DAILY   bisacodyl (DULCOLAX) 5 MG EC tablet Take 5 mg by mouth once. For golytely prep x 4 for colon 9-25   celecoxib  (CELEBREX ) 200 MG capsule Take 1 capsule (200 mg total) by mouth 2 (two) times daily.   Cholecalciferol (THERA-D 2000) 50 MCG (2000 UT) TABS 1 tab by mouth once daily   furosemide  (LASIX ) 20 MG tablet TAKE 1 TABLET(20 MG) BY MOUTH DAILY AS NEEDED   valsartan  (DIOVAN ) 80 MG tablet TAKE 1 TABLET(80 MG) BY MOUTH DAILY   No facility-administered encounter medications on file as of 11/26/2023.    Allergies (verified) Patient has no known allergies.   History: Past Medical History:  Diagnosis Date   Anemia    past hx of anemia   Arthritis    Chicken pox    HLD (hyperlipidemia) 11/17/2018   Hypertension    Past Surgical History:  Procedure Laterality Date   CARPAL TUNNEL RELEASE Left 06/22/2017   Procedure: LEFT CARPAL TUNNEL RELEASE;  Surgeon: Murrell Drivers, MD;  Location: [facility];  Service: Orthopedics;  Laterality: Left;   COLONOSCOPY     10 + yrs ago was normal per pt    TONSILLECTOMY  1962   TUBAL LIGATION  1982   Family History  Problem Relation Age of Onset   Arthritis Mother    Heart attack Mother    Stroke Mother    Hypertension Father  Colon cancer Neg Hx    Colon polyps Neg Hx    Esophageal cancer Neg Hx    Rectal cancer Neg Hx    Stomach cancer Neg Hx    Breast cancer Neg Hx    Social History   Socioeconomic History   Marital status: Widowed    Spouse name: Not on file   Number of children: 1   Years of education: Not on file   Highest education level: Not on file  Occupational History   Occupation: Part time  Tobacco Use   Smoking status: Never   Smokeless tobacco: Never  Vaping Use   Vaping status: Never Used  Substance and Sexual Activity   Alcohol use: No   Drug use:  No   Sexual activity: Not on file  Other Topics Concern   Not on file  Social History Narrative   Lives alone/2025   Social Drivers of Health   Financial Resource Strain: Low Risk  (11/25/2022)   Overall Financial Resource Strain (CARDIA)    Difficulty of Paying Living Expenses: Not hard at all  Food Insecurity: No Food Insecurity (11/25/2022)   Hunger Vital Sign    Worried About Running Out of Food in the Last Year: Never true    Ran Out of Food in the Last Year: Never true  Transportation Needs: No Transportation Needs (11/25/2022)   PRAPARE - Administrator, Civil Service (Medical): No    Lack of Transportation (Non-Medical): No  Physical Activity: Sufficiently Active (11/26/2023)   Exercise Vital Sign    Days of Exercise per Week: 6 days    Minutes of Exercise per Session: 120 min  Stress: No Stress Concern Present (11/26/2023)   [employer] of Occupational Health - Occupational Stress Questionnaire    Feeling of Stress: Only a little  Social Connections: Moderately Integrated (11/26/2023)   Social Connection and Isolation Panel    Frequency of Communication with Friends and Family: More than three times a week    Frequency of Social Gatherings with Friends and Family: More than three times a week    Attends Religious Services: More than 4 times per year    Active Member of [employer] or Organizations: Yes    Attends Banker Meetings: Never    Marital Status: Widowed    Tobacco Counseling Counseling given: Not Answered    Clinical Intake:  Pre-visit preparation completed: Yes  Pain : No/denies pain     BMI - recorded: 25.89 Nutritional Status: BMI 25 -29 Overweight Nutritional Risks: None Diabetes: No  Lab Results  Component Value Date   HGBA1C 5.6 06/11/2023   HGBA1C 5.5 06/10/2022   HGBA1C 5.4 06/05/2021     How often do you need to have someone help you when you read instructions, pamphlets, or other written materials from your  doctor or pharmacy?: 1 - Never  Interpreter Needed?: No  Information entered by :: Karalyn Kadel, RMA   Activities of Daily Living     11/26/2023    8:49 AM  In your present state of health, do you have any difficulty performing the following activities:  Hearing? 0  Vision? 0  Difficulty concentrating or making decisions? 0  Walking or climbing stairs? 0  Dressing or bathing? 0  Doing errands, shopping? 0  Preparing Food and eating ? N  Using the Toilet? N  In the past six months, have you accidently leaked urine? N  Do you have problems with loss of bowel control?  N  Managing your Medications? N  Managing your Finances? N  Housekeeping or managing your Housekeeping? N    Patient Care Team: Norleen Lynwood ORN, MD as PCP - General (Internal Medicine) [place], [place], [place] as Referring Physician (Optometry)  I have updated your Care Teams any recent Medical Services you may have received from other providers in the past year.     Assessment:   This is a routine wellness examination for [place].  Hearing/Vision screen Hearing Screening - Comments:: Denies hearing difficulties   Vision Screening - Comments:: Wears eyeglasses/[facility]   Goals Addressed               This Visit's Progress     Patient Stated (pt-stated)   On track     Continue to stay physically active, independent and enjoy church.       Depression Screen     11/26/2023    9:03 AM 09/02/2023    8:11 AM 06/11/2023    8:08 AM 11/25/2022    9:36 AM 06/10/2022    8:07 AM 06/05/2021    8:21 AM 06/05/2021    8:04 AM  PHQ 2/9 Scores  PHQ - 2 Score 2 0 0 0 0 1 0  PHQ- 9 Score 2   0 0  0    Fall Risk     11/26/2023    9:01 AM 09/02/2023    8:11 AM 06/11/2023    8:15 AM 11/25/2022    9:35 AM 06/10/2022    8:08 AM  Fall Risk   Falls in the past year? 0 0 0 0 0  Number falls in past yr: 0 0 0 0 0  Injury with Fall? 0 0 0 0 0  Risk for fall due to :  No Fall Risks No Fall Risks No Fall Risks No Fall  Risks  Follow up Falls evaluation completed;Falls prevention discussed Falls evaluation completed Falls evaluation completed Falls prevention discussed Falls evaluation completed;Education provided    MEDICARE RISK AT HOME:  Medicare Risk at Home Any stairs in or around the home?: Yes (outside in front) If so, are there any without handrails?: No Home free of loose throw rugs in walkways, pet beds, electrical cords, etc?: Yes Adequate lighting in your home to reduce risk of falls?: Yes Life alert?: No Use of a cane, walker or w/c?: No Grab bars in the bathroom?: No Shower chair or bench in shower?: No Elevated toilet seat or a handicapped toilet?: No  TIMED UP AND GO:  Was the test performed?  No  Cognitive Function: Declined/Normal: No cognitive concerns noted by patient or family. Patient alert, oriented, able to answer questions appropriately and recall recent events. No signs of memory loss or confusion.        11/25/2022    9:38 AM  6CIT Screen  What Year? 0 points  What month? 0 points  What time? 0 points  Count back from 20 0 points  Months in reverse 0 points  Repeat phrase 0 points  Total Score 0 points    Immunizations Immunization History  Administered Date(s) Administered   Fluad Quad(high Dose 65+) 01/22/2023   INFLUENZA, HIGH DOSE SEASONAL PF 12/30/2017, 11/15/2018, 01/17/2022   Influenza-Unspecified 11/15/2018, 01/30/2020, 02/18/2021   PFIZER(Purple Top)SARS-COV-2 Vaccination 05/24/2019, 06/14/2019, 01/16/2020, 12/31/2020   Pfizer Covid-19 Vaccine Bivalent Booster 57yrs & up 01/22/2023   Pfizer(Comirnaty)Fall Seasonal Vaccine 12 years and older 12/30/2021   Pneumococcal Conjugate-13 05/17/2018   Pneumococcal Polysaccharide-23 05/31/2020  Pneumococcal-Unspecified 03/04/2021   Tdap 02/10/2018   Zoster Recombinant(Shingrix) 03/04/2021, 04/01/2021, 05/09/2021    Screening Tests Health Maintenance  Topic Date Due   COVID-19 Vaccine (7 - 2024-25  season) 03/19/2023   Medicare Annual Wellness (AWV)  11/25/2023   INFLUENZA VACCINE  10/30/2023   MAMMOGRAM  01/12/2025   DTaP/Tdap/Td (2 - Td or Tdap) 02/11/2028   Colonoscopy  12/23/2028   Pneumococcal Vaccine: 50+ Years  Completed   DEXA SCAN  Completed   Hepatitis C Screening  Completed   Zoster Vaccines- Shingrix  Completed   HPV VACCINES  Aged Out   Meningococcal B Vaccine  Aged Out    Health Maintenance  Health Maintenance Due  Topic Date Due   COVID-19 Vaccine (7 - 2024-25 season) 03/19/2023   Medicare Annual Wellness (AWV)  11/25/2023   INFLUENZA VACCINE  10/30/2023   Health Maintenance Items Addressed: See Nurse Notes at the end of this note  Additional Screening:  Vision Screening: Recommended annual ophthalmology exams for early detection of glaucoma and other disorders of the eye. Would you like a referral to an eye doctor? No    Dental Screening: Recommended annual dental exams for proper oral hygiene  Community Resource Referral / Chronic Care Management: CRR required this visit?  No   CCM required this visit?  No   Plan:    I have personally reviewed and noted the following in the patient's chart:   Medical and social history Use of alcohol, tobacco or illicit drugs  Current medications and supplements including opioid prescriptions. Patient is not currently taking opioid prescriptions. Functional ability and status Nutritional status Physical activity Advanced directives List of other physicians Hospitalizations, surgeries, and ER visits in previous 12 months Vitals Screenings to include cognitive, depression, and falls Referrals and appointments  In addition, I have reviewed and discussed with patient certain preventive protocols, quality metrics, and best practice recommendations. A written personalized care plan for preventive services as well as general preventive health recommendations were provided to patient.   Laneta Guerin L Shadonna Benedick,  CMA   11/26/2023   After Visit Summary: (Mail) Due to this being a telephonic visit, the after visit summary with patients personalized plan was offered to patient via mail   Notes: Nothing significant to report at this time.

## 2023-12-21 ENCOUNTER — Other Ambulatory Visit: Payer: Self-pay | Admitting: Internal Medicine

## 2023-12-21 DIAGNOSIS — Z1231 Encounter for screening mammogram for malignant neoplasm of breast: Secondary | ICD-10-CM

## 2023-12-31 ENCOUNTER — Encounter: Payer: Self-pay | Admitting: Internal Medicine

## 2023-12-31 NOTE — Progress Notes (Signed)
 Pharmacy Quality Measure Review  This patient is appearing on a report for being at risk of failing the adherence measure for cholesterol (statin) medications this calendar year.   Medication: Atorvastatin  40mg  Last fill date: 08/30 for 90 day supply  This patient is appearing on a report for being at risk of failing the adherence measure for hypertension (ACEi/ARB) medications this calendar year.   Medication: Valsartan  80mg  Last fill date: 09/02 for 90 day supply  Insurance report was not up to date. No action needed at this time.   Angela Baalmann, PharmD [facility] Pharmacist

## 2024-01-15 ENCOUNTER — Ambulatory Visit
Admission: RE | Admit: 2024-01-15 | Discharge: 2024-01-15 | Disposition: A | Source: Ambulatory Visit | Attending: Internal Medicine | Admitting: Internal Medicine

## 2024-01-15 DIAGNOSIS — Z1231 Encounter for screening mammogram for malignant neoplasm of breast: Secondary | ICD-10-CM | POA: Diagnosis not present

## 2024-06-27 ENCOUNTER — Encounter: Admitting: Family Medicine

## 2024-12-01 ENCOUNTER — Ambulatory Visit
